# Patient Record
Sex: Female | Born: 1940
Health system: Southern US, Community
[De-identification: ages and names within clinical notes are randomized; demographics above are authoritative.]

## PROBLEM LIST (undated history)

## (undated) DIAGNOSIS — I5032 Chronic diastolic (congestive) heart failure: Secondary | ICD-10-CM

## (undated) DIAGNOSIS — I517 Cardiomegaly: Secondary | ICD-10-CM

## (undated) DIAGNOSIS — I447 Left bundle-branch block, unspecified: Secondary | ICD-10-CM

## (undated) DIAGNOSIS — Z6832 Body mass index (BMI) 32.0-32.9, adult: Secondary | ICD-10-CM

## (undated) DIAGNOSIS — H3530 Unspecified macular degeneration: Secondary | ICD-10-CM

## (undated) DIAGNOSIS — M2040 Other hammer toe(s) (acquired), unspecified foot: Secondary | ICD-10-CM

## (undated) DIAGNOSIS — H9190 Unspecified hearing loss, unspecified ear: Secondary | ICD-10-CM

## (undated) DIAGNOSIS — R269 Unspecified abnormalities of gait and mobility: Secondary | ICD-10-CM

## (undated) DIAGNOSIS — R42 Dizziness and giddiness: Secondary | ICD-10-CM

## (undated) DIAGNOSIS — E559 Vitamin D deficiency, unspecified: Secondary | ICD-10-CM

## (undated) DIAGNOSIS — D72819 Decreased white blood cell count, unspecified: Secondary | ICD-10-CM

## (undated) DIAGNOSIS — Z950 Presence of cardiac pacemaker: Secondary | ICD-10-CM

## (undated) DIAGNOSIS — I340 Nonrheumatic mitral (valve) insufficiency: Secondary | ICD-10-CM

## (undated) DIAGNOSIS — I4430 Unspecified atrioventricular block: Secondary | ICD-10-CM

## (undated) DIAGNOSIS — Z7901 Long term (current) use of anticoagulants: Secondary | ICD-10-CM

## (undated) DIAGNOSIS — M150 Primary generalized (osteo)arthritis: Secondary | ICD-10-CM

## (undated) DIAGNOSIS — M8580 Other specified disorders of bone density and structure, unspecified site: Secondary | ICD-10-CM

## (undated) DIAGNOSIS — B351 Tinea unguium: Secondary | ICD-10-CM

## (undated) DIAGNOSIS — M6281 Muscle weakness (generalized): Secondary | ICD-10-CM

## (undated) DIAGNOSIS — N952 Postmenopausal atrophic vaginitis: Secondary | ICD-10-CM

## (undated) DIAGNOSIS — I441 Atrioventricular block, second degree: Secondary | ICD-10-CM

## (undated) DIAGNOSIS — G3184 Mild cognitive impairment, so stated: Secondary | ICD-10-CM

## (undated) DIAGNOSIS — I10 Essential (primary) hypertension: Secondary | ICD-10-CM

## (undated) DIAGNOSIS — Z131 Encounter for screening for diabetes mellitus: Secondary | ICD-10-CM

## (undated) DIAGNOSIS — R7989 Other specified abnormal findings of blood chemistry: Secondary | ICD-10-CM

## (undated) DIAGNOSIS — I4892 Unspecified atrial flutter: Secondary | ICD-10-CM

## (undated) DIAGNOSIS — E785 Hyperlipidemia, unspecified: Secondary | ICD-10-CM

## (undated) DIAGNOSIS — I110 Hypertensive heart disease with heart failure: Secondary | ICD-10-CM

## (undated) DIAGNOSIS — I482 Chronic atrial fibrillation, unspecified: Secondary | ICD-10-CM

## (undated) DIAGNOSIS — N393 Stress incontinence (female) (male): Secondary | ICD-10-CM

## (undated) DIAGNOSIS — R262 Difficulty in walking, not elsewhere classified: Secondary | ICD-10-CM

## (undated) HISTORY — DX: Presence of cardiac pacemaker: Z95.0

## (undated) HISTORY — DX: Chronic atrial fibrillation, unspecified: I48.20

## (undated) HISTORY — DX: Unspecified hearing loss, unspecified ear: H91.90

## (undated) HISTORY — DX: Left bundle-branch block, unspecified: I44.7

## (undated) HISTORY — DX: Unspecified abnormalities of gait and mobility: R26.9

## (undated) HISTORY — PX: REVISION TOTAL HIP ARTHROPLASTY: SHX766

## (undated) HISTORY — DX: Encounter for screening for diabetes mellitus: Z13.1

## (undated) HISTORY — DX: Other specified disorders of bone density and structure, unspecified site: M85.80

## (undated) HISTORY — DX: Stress incontinence (female) (male): N39.3

## (undated) HISTORY — DX: Tinea unguium: B35.1

## (undated) HISTORY — DX: Nonrheumatic mitral (valve) insufficiency: I34.0

## (undated) HISTORY — DX: Other hammer toe(s) (acquired), unspecified foot: M20.40

## (undated) HISTORY — DX: Mild cognitive impairment, so stated: G31.84

## (undated) HISTORY — DX: Hypertensive heart disease with heart failure: I11.0

## (undated) HISTORY — DX: Other specified abnormal findings of blood chemistry: R79.89

## (undated) HISTORY — DX: Muscle weakness (generalized): M62.81

## (undated) HISTORY — DX: Decreased white blood cell count, unspecified: D72.819

## (undated) HISTORY — DX: Primary generalized (osteo)arthritis: M15.0

## (undated) HISTORY — DX: Postmenopausal atrophic vaginitis: N95.2

## (undated) HISTORY — DX: Atrioventricular block, second degree: I44.1

## (undated) HISTORY — DX: Hyperlipidemia, unspecified: E78.5

## (undated) HISTORY — DX: Unspecified atrioventricular block: I44.30

## (undated) HISTORY — DX: Vitamin D deficiency, unspecified: E55.9

## (undated) HISTORY — DX: Essential (primary) hypertension: I10

## (undated) HISTORY — DX: Difficulty in walking, not elsewhere classified: R26.2

## (undated) HISTORY — DX: Chronic diastolic (congestive) heart failure: I50.32

## (undated) HISTORY — DX: Unspecified atrial flutter: I48.92

## (undated) HISTORY — DX: Unspecified macular degeneration: H35.30

## (undated) HISTORY — DX: Long term (current) use of anticoagulants: Z79.01

## (undated) HISTORY — DX: Dizziness and giddiness: R42

## (undated) HISTORY — DX: Cardiomegaly: I51.7

## (undated) HISTORY — PX: CATARACT EXTRACTION: SUR2

## (undated) HISTORY — DX: Body mass index (bmi) 32.0-32.9, adult: Z68.32

---

## 1981-09-26 HISTORY — PX: BONY PELVIS SURGERY: SHX572

## 1981-09-26 HISTORY — PX: ANKLE FRACTURE SURGERY: SHX122

## 1981-09-26 HISTORY — PX: CHEST SURGERY: SHX595

## 1999-09-02 ENCOUNTER — Encounter: Admission: RE | Admit: 1999-09-02 | Discharge: 1999-09-02 | Payer: Self-pay | Admitting: Family Medicine

## 1999-09-02 ENCOUNTER — Encounter: Payer: Self-pay | Admitting: Family Medicine

## 2000-08-29 ENCOUNTER — Encounter: Payer: Self-pay | Admitting: Family Medicine

## 2000-08-29 ENCOUNTER — Ambulatory Visit (HOSPITAL_COMMUNITY): Admission: RE | Admit: 2000-08-29 | Discharge: 2000-08-29 | Payer: Self-pay | Admitting: Family Medicine

## 2000-12-14 ENCOUNTER — Other Ambulatory Visit: Admission: RE | Admit: 2000-12-14 | Discharge: 2000-12-14 | Payer: Self-pay | Admitting: Family Medicine

## 2001-01-16 ENCOUNTER — Encounter: Admission: RE | Admit: 2001-01-16 | Discharge: 2001-01-16 | Payer: Self-pay | Admitting: Family Medicine

## 2001-01-16 ENCOUNTER — Encounter: Payer: Self-pay | Admitting: Family Medicine

## 2001-12-14 ENCOUNTER — Other Ambulatory Visit: Admission: RE | Admit: 2001-12-14 | Discharge: 2001-12-14 | Payer: Self-pay | Admitting: Family Medicine

## 2002-03-21 ENCOUNTER — Encounter: Payer: Self-pay | Admitting: Family Medicine

## 2002-03-21 ENCOUNTER — Encounter: Admission: RE | Admit: 2002-03-21 | Discharge: 2002-03-21 | Payer: Self-pay | Admitting: Family Medicine

## 2003-03-27 ENCOUNTER — Encounter: Payer: Self-pay | Admitting: Family Medicine

## 2003-03-27 ENCOUNTER — Encounter: Admission: RE | Admit: 2003-03-27 | Discharge: 2003-03-27 | Payer: Self-pay | Admitting: Family Medicine

## 2003-05-01 ENCOUNTER — Other Ambulatory Visit: Admission: RE | Admit: 2003-05-01 | Discharge: 2003-05-01 | Payer: Self-pay | Admitting: Family Medicine

## 2004-05-18 ENCOUNTER — Ambulatory Visit (HOSPITAL_COMMUNITY): Admission: RE | Admit: 2004-05-18 | Discharge: 2004-05-18 | Payer: Self-pay | Admitting: Family Medicine

## 2004-06-25 ENCOUNTER — Other Ambulatory Visit: Admission: RE | Admit: 2004-06-25 | Discharge: 2004-06-25 | Payer: Self-pay | Admitting: Family Medicine

## 2004-06-30 ENCOUNTER — Encounter: Admission: RE | Admit: 2004-06-30 | Discharge: 2004-06-30 | Payer: Self-pay | Admitting: Family Medicine

## 2005-06-16 ENCOUNTER — Ambulatory Visit (HOSPITAL_COMMUNITY): Admission: RE | Admit: 2005-06-16 | Discharge: 2005-06-16 | Payer: Self-pay | Admitting: Family Medicine

## 2005-08-30 ENCOUNTER — Encounter: Admission: RE | Admit: 2005-08-30 | Discharge: 2005-08-30 | Payer: Self-pay | Admitting: Family Medicine

## 2006-07-11 ENCOUNTER — Ambulatory Visit (HOSPITAL_COMMUNITY): Admission: RE | Admit: 2006-07-11 | Discharge: 2006-07-11 | Payer: Self-pay | Admitting: Family Medicine

## 2006-09-04 ENCOUNTER — Other Ambulatory Visit: Admission: RE | Admit: 2006-09-04 | Discharge: 2006-09-04 | Payer: Self-pay | Admitting: Family Medicine

## 2007-06-11 ENCOUNTER — Ambulatory Visit (HOSPITAL_COMMUNITY): Admission: RE | Admit: 2007-06-11 | Discharge: 2007-06-11 | Payer: Self-pay | Admitting: Family Medicine

## 2007-06-11 ENCOUNTER — Ambulatory Visit: Payer: Self-pay | Admitting: Vascular Surgery

## 2007-06-11 ENCOUNTER — Encounter: Payer: Self-pay | Admitting: Family Medicine

## 2007-07-18 ENCOUNTER — Ambulatory Visit (HOSPITAL_COMMUNITY): Admission: RE | Admit: 2007-07-18 | Discharge: 2007-07-18 | Payer: Self-pay | Admitting: Family Medicine

## 2007-11-13 ENCOUNTER — Ambulatory Visit: Payer: Self-pay | Admitting: Gastroenterology

## 2007-11-26 ENCOUNTER — Encounter: Payer: Self-pay | Admitting: Gastroenterology

## 2007-11-26 ENCOUNTER — Ambulatory Visit: Payer: Self-pay | Admitting: Gastroenterology

## 2008-07-18 ENCOUNTER — Encounter: Admission: RE | Admit: 2008-07-18 | Discharge: 2008-07-18 | Payer: Self-pay | Admitting: Family Medicine

## 2009-07-20 ENCOUNTER — Encounter: Admission: RE | Admit: 2009-07-20 | Discharge: 2009-07-20 | Payer: Self-pay | Admitting: Family Medicine

## 2010-07-22 ENCOUNTER — Encounter: Admission: RE | Admit: 2010-07-22 | Discharge: 2010-07-22 | Payer: Self-pay | Admitting: Family Medicine

## 2011-07-13 ENCOUNTER — Other Ambulatory Visit: Payer: Self-pay | Admitting: Family Medicine

## 2011-07-13 DIAGNOSIS — Z1231 Encounter for screening mammogram for malignant neoplasm of breast: Secondary | ICD-10-CM

## 2011-07-27 ENCOUNTER — Ambulatory Visit
Admission: RE | Admit: 2011-07-27 | Discharge: 2011-07-27 | Disposition: A | Discharge status: Home / Self Care | Payer: Medicare Other | Source: Ambulatory Visit | Attending: Family Medicine | Admitting: Family Medicine

## 2011-07-27 DIAGNOSIS — Z1231 Encounter for screening mammogram for malignant neoplasm of breast: Secondary | ICD-10-CM

## 2012-06-22 ENCOUNTER — Other Ambulatory Visit: Payer: Self-pay | Admitting: Family Medicine

## 2012-06-22 DIAGNOSIS — Z1231 Encounter for screening mammogram for malignant neoplasm of breast: Secondary | ICD-10-CM

## 2012-07-27 ENCOUNTER — Ambulatory Visit: Payer: Medicare Other

## 2012-08-10 ENCOUNTER — Ambulatory Visit
Admission: RE | Admit: 2012-08-10 | Discharge: 2012-08-10 | Disposition: A | Discharge status: Home / Self Care | Payer: Medicare Other | Source: Ambulatory Visit | Attending: Family Medicine | Admitting: Family Medicine

## 2012-08-10 DIAGNOSIS — Z1231 Encounter for screening mammogram for malignant neoplasm of breast: Secondary | ICD-10-CM

## 2013-08-05 ENCOUNTER — Other Ambulatory Visit: Payer: Self-pay

## 2013-08-05 ENCOUNTER — Other Ambulatory Visit (HOSPITAL_COMMUNITY): Payer: Self-pay | Admitting: Family Medicine

## 2013-08-05 DIAGNOSIS — Z1231 Encounter for screening mammogram for malignant neoplasm of breast: Secondary | ICD-10-CM

## 2013-08-21 ENCOUNTER — Ambulatory Visit (HOSPITAL_COMMUNITY)
Admission: RE | Admit: 2013-08-21 | Discharge: 2013-08-21 | Disposition: A | Discharge status: Home / Self Care | Payer: Medicare Other | Source: Ambulatory Visit | Attending: Family Medicine | Admitting: Family Medicine

## 2013-08-21 DIAGNOSIS — Z1231 Encounter for screening mammogram for malignant neoplasm of breast: Secondary | ICD-10-CM | POA: Insufficient documentation

## 2014-07-24 ENCOUNTER — Other Ambulatory Visit (HOSPITAL_COMMUNITY): Payer: Self-pay | Admitting: Family Medicine

## 2014-07-24 DIAGNOSIS — Z1231 Encounter for screening mammogram for malignant neoplasm of breast: Secondary | ICD-10-CM

## 2014-08-25 ENCOUNTER — Ambulatory Visit (HOSPITAL_COMMUNITY)
Admission: RE | Admit: 2014-08-25 | Discharge: 2014-08-25 | Disposition: A | Discharge status: Home / Self Care | Payer: Medicare HMO | Source: Ambulatory Visit | Attending: Family Medicine | Admitting: Family Medicine

## 2014-08-25 DIAGNOSIS — Z1231 Encounter for screening mammogram for malignant neoplasm of breast: Secondary | ICD-10-CM | POA: Insufficient documentation

## 2015-03-23 ENCOUNTER — Other Ambulatory Visit: Payer: Self-pay

## 2015-04-09 ENCOUNTER — Encounter: Payer: Self-pay | Admitting: Gastroenterology

## 2015-08-12 ENCOUNTER — Other Ambulatory Visit: Payer: Self-pay

## 2015-08-12 DIAGNOSIS — Z1231 Encounter for screening mammogram for malignant neoplasm of breast: Secondary | ICD-10-CM

## 2015-09-18 ENCOUNTER — Ambulatory Visit
Admission: RE | Admit: 2015-09-18 | Discharge: 2015-09-18 | Disposition: A | Discharge status: Home / Self Care | Payer: Medicare HMO | Source: Ambulatory Visit

## 2015-09-18 DIAGNOSIS — Z1231 Encounter for screening mammogram for malignant neoplasm of breast: Secondary | ICD-10-CM

## 2015-10-28 HISTORY — PX: PACEMAKER IMPLANT: EP1218

## 2015-11-09 DIAGNOSIS — D72819 Decreased white blood cell count, unspecified: Secondary | ICD-10-CM | POA: Insufficient documentation

## 2015-11-09 DIAGNOSIS — G629 Polyneuropathy, unspecified: Secondary | ICD-10-CM

## 2015-11-09 DIAGNOSIS — E785 Hyperlipidemia, unspecified: Secondary | ICD-10-CM

## 2015-11-09 DIAGNOSIS — M159 Polyosteoarthritis, unspecified: Secondary | ICD-10-CM

## 2015-11-09 DIAGNOSIS — M8949 Other hypertrophic osteoarthropathy, multiple sites: Secondary | ICD-10-CM

## 2015-11-09 DIAGNOSIS — M8580 Other specified disorders of bone density and structure, unspecified site: Secondary | ICD-10-CM

## 2015-11-09 DIAGNOSIS — M150 Primary generalized (osteo)arthritis: Secondary | ICD-10-CM | POA: Insufficient documentation

## 2015-11-09 DIAGNOSIS — R269 Unspecified abnormalities of gait and mobility: Secondary | ICD-10-CM

## 2015-11-09 HISTORY — DX: Decreased white blood cell count, unspecified: D72.819

## 2015-11-09 HISTORY — DX: Polyosteoarthritis, unspecified: M15.9

## 2015-11-09 HISTORY — DX: Other specified disorders of bone density and structure, unspecified site: M85.80

## 2015-11-09 HISTORY — DX: Unspecified abnormalities of gait and mobility: R26.9

## 2015-11-09 HISTORY — DX: Polyneuropathy, unspecified: G62.9

## 2015-11-09 HISTORY — DX: Hyperlipidemia, unspecified: E78.5

## 2015-11-09 HISTORY — DX: Other hypertrophic osteoarthropathy, multiple sites: M89.49

## 2015-11-09 HISTORY — DX: Primary generalized (osteo)arthritis: M15.0

## 2015-11-10 DIAGNOSIS — I4430 Unspecified atrioventricular block: Secondary | ICD-10-CM | POA: Insufficient documentation

## 2015-11-10 HISTORY — DX: Unspecified atrioventricular block: I44.30

## 2015-11-11 DIAGNOSIS — Z950 Presence of cardiac pacemaker: Secondary | ICD-10-CM | POA: Insufficient documentation

## 2015-11-11 HISTORY — DX: Presence of cardiac pacemaker: Z95.0

## 2015-12-16 DIAGNOSIS — I441 Atrioventricular block, second degree: Secondary | ICD-10-CM | POA: Insufficient documentation

## 2015-12-16 DIAGNOSIS — R262 Difficulty in walking, not elsewhere classified: Secondary | ICD-10-CM

## 2015-12-16 HISTORY — DX: Atrioventricular block, second degree: I44.1

## 2015-12-16 HISTORY — DX: Difficulty in walking, not elsewhere classified: R26.2

## 2016-01-17 DIAGNOSIS — I5032 Chronic diastolic (congestive) heart failure: Secondary | ICD-10-CM

## 2016-01-17 DIAGNOSIS — I482 Chronic atrial fibrillation, unspecified: Secondary | ICD-10-CM

## 2016-01-17 DIAGNOSIS — I447 Left bundle-branch block, unspecified: Secondary | ICD-10-CM | POA: Insufficient documentation

## 2016-01-17 DIAGNOSIS — I4892 Unspecified atrial flutter: Secondary | ICD-10-CM

## 2016-01-17 HISTORY — DX: Chronic atrial fibrillation, unspecified: I48.20

## 2016-01-17 HISTORY — DX: Chronic diastolic (congestive) heart failure: I50.32

## 2016-01-17 HISTORY — DX: Unspecified atrial flutter: I48.92

## 2016-01-17 HISTORY — DX: Left bundle-branch block, unspecified: I44.7

## 2016-02-03 DIAGNOSIS — I110 Hypertensive heart disease with heart failure: Secondary | ICD-10-CM | POA: Insufficient documentation

## 2016-02-03 DIAGNOSIS — G3184 Mild cognitive impairment, so stated: Secondary | ICD-10-CM

## 2016-02-03 HISTORY — DX: Mild cognitive impairment of uncertain or unknown etiology: G31.84

## 2016-02-03 HISTORY — DX: Hypertensive heart disease with heart failure: I11.0

## 2016-03-25 DIAGNOSIS — B351 Tinea unguium: Secondary | ICD-10-CM

## 2016-03-25 DIAGNOSIS — M6281 Muscle weakness (generalized): Secondary | ICD-10-CM | POA: Insufficient documentation

## 2016-03-25 DIAGNOSIS — Z6832 Body mass index (BMI) 32.0-32.9, adult: Secondary | ICD-10-CM

## 2016-03-25 DIAGNOSIS — N952 Postmenopausal atrophic vaginitis: Secondary | ICD-10-CM

## 2016-03-25 DIAGNOSIS — M2040 Other hammer toe(s) (acquired), unspecified foot: Secondary | ICD-10-CM

## 2016-03-25 DIAGNOSIS — H9190 Unspecified hearing loss, unspecified ear: Secondary | ICD-10-CM

## 2016-03-25 HISTORY — DX: Body mass index (BMI) 32.0-32.9, adult: Z68.32

## 2016-03-25 HISTORY — DX: Muscle weakness (generalized): M62.81

## 2016-03-25 HISTORY — DX: Unspecified hearing loss, unspecified ear: H91.90

## 2016-03-25 HISTORY — DX: Postmenopausal atrophic vaginitis: N95.2

## 2016-03-25 HISTORY — DX: Tinea unguium: B35.1

## 2016-03-25 HISTORY — DX: Other hammer toe(s) (acquired), unspecified foot: M20.40

## 2016-04-17 DIAGNOSIS — Z7901 Long term (current) use of anticoagulants: Secondary | ICD-10-CM | POA: Insufficient documentation

## 2016-04-17 HISTORY — DX: Long term (current) use of anticoagulants: Z79.01

## 2016-10-07 DIAGNOSIS — Z950 Presence of cardiac pacemaker: Secondary | ICD-10-CM | POA: Diagnosis not present

## 2016-12-09 DIAGNOSIS — I5032 Chronic diastolic (congestive) heart failure: Secondary | ICD-10-CM | POA: Diagnosis not present

## 2016-12-09 DIAGNOSIS — G629 Polyneuropathy, unspecified: Secondary | ICD-10-CM | POA: Diagnosis not present

## 2016-12-09 DIAGNOSIS — Z950 Presence of cardiac pacemaker: Secondary | ICD-10-CM | POA: Diagnosis not present

## 2016-12-09 DIAGNOSIS — I483 Typical atrial flutter: Secondary | ICD-10-CM | POA: Diagnosis not present

## 2016-12-09 DIAGNOSIS — Z7901 Long term (current) use of anticoagulants: Secondary | ICD-10-CM | POA: Diagnosis not present

## 2016-12-09 DIAGNOSIS — R262 Difficulty in walking, not elsewhere classified: Secondary | ICD-10-CM | POA: Diagnosis not present

## 2016-12-09 DIAGNOSIS — I110 Hypertensive heart disease with heart failure: Secondary | ICD-10-CM | POA: Diagnosis not present

## 2016-12-20 DIAGNOSIS — Z45018 Encounter for adjustment and management of other part of cardiac pacemaker: Secondary | ICD-10-CM | POA: Diagnosis not present

## 2016-12-20 DIAGNOSIS — I447 Left bundle-branch block, unspecified: Secondary | ICD-10-CM | POA: Diagnosis not present

## 2017-02-08 DIAGNOSIS — I5032 Chronic diastolic (congestive) heart failure: Secondary | ICD-10-CM | POA: Diagnosis not present

## 2017-02-08 DIAGNOSIS — I110 Hypertensive heart disease with heart failure: Secondary | ICD-10-CM | POA: Diagnosis not present

## 2017-02-08 DIAGNOSIS — Z950 Presence of cardiac pacemaker: Secondary | ICD-10-CM | POA: Diagnosis not present

## 2017-02-08 DIAGNOSIS — Z7901 Long term (current) use of anticoagulants: Secondary | ICD-10-CM | POA: Diagnosis not present

## 2017-05-25 DIAGNOSIS — Z6841 Body Mass Index (BMI) 40.0 and over, adult: Secondary | ICD-10-CM | POA: Diagnosis not present

## 2017-05-25 DIAGNOSIS — E7800 Pure hypercholesterolemia, unspecified: Secondary | ICD-10-CM | POA: Diagnosis not present

## 2017-05-25 DIAGNOSIS — Z0000 Encounter for general adult medical examination without abnormal findings: Secondary | ICD-10-CM | POA: Diagnosis not present

## 2017-05-25 DIAGNOSIS — Z79899 Other long term (current) drug therapy: Secondary | ICD-10-CM | POA: Diagnosis not present

## 2017-05-25 DIAGNOSIS — I110 Hypertensive heart disease with heart failure: Secondary | ICD-10-CM | POA: Diagnosis not present

## 2017-05-25 DIAGNOSIS — Z7901 Long term (current) use of anticoagulants: Secondary | ICD-10-CM | POA: Diagnosis not present

## 2017-05-25 DIAGNOSIS — M130 Polyarthritis, unspecified: Secondary | ICD-10-CM | POA: Diagnosis not present

## 2017-05-25 DIAGNOSIS — Z950 Presence of cardiac pacemaker: Secondary | ICD-10-CM | POA: Diagnosis not present

## 2017-05-25 DIAGNOSIS — I509 Heart failure, unspecified: Secondary | ICD-10-CM | POA: Diagnosis not present

## 2017-06-06 DIAGNOSIS — Z131 Encounter for screening for diabetes mellitus: Secondary | ICD-10-CM

## 2017-06-06 DIAGNOSIS — I10 Essential (primary) hypertension: Secondary | ICD-10-CM

## 2017-06-06 DIAGNOSIS — R7303 Prediabetes: Secondary | ICD-10-CM

## 2017-06-06 HISTORY — DX: Encounter for screening for diabetes mellitus: Z13.1

## 2017-06-06 HISTORY — DX: Essential (primary) hypertension: I10

## 2017-06-06 HISTORY — DX: Prediabetes: R73.03

## 2017-06-09 DIAGNOSIS — E559 Vitamin D deficiency, unspecified: Secondary | ICD-10-CM

## 2017-06-09 DIAGNOSIS — R42 Dizziness and giddiness: Secondary | ICD-10-CM

## 2017-06-09 DIAGNOSIS — H3530 Unspecified macular degeneration: Secondary | ICD-10-CM

## 2017-06-09 DIAGNOSIS — R7989 Other specified abnormal findings of blood chemistry: Secondary | ICD-10-CM | POA: Insufficient documentation

## 2017-06-09 DIAGNOSIS — N393 Stress incontinence (female) (male): Secondary | ICD-10-CM | POA: Insufficient documentation

## 2017-06-09 HISTORY — DX: Vitamin D deficiency, unspecified: E55.9

## 2017-06-09 HISTORY — DX: Other specified abnormal findings of blood chemistry: R79.89

## 2017-06-09 HISTORY — DX: Dizziness and giddiness: R42

## 2017-06-09 HISTORY — DX: Stress incontinence (female) (male): N39.3

## 2017-06-09 HISTORY — DX: Unspecified macular degeneration: H35.30

## 2017-06-12 DIAGNOSIS — R771 Abnormality of globulin: Secondary | ICD-10-CM | POA: Diagnosis not present

## 2017-06-12 DIAGNOSIS — I110 Hypertensive heart disease with heart failure: Secondary | ICD-10-CM | POA: Diagnosis not present

## 2017-06-12 DIAGNOSIS — Z131 Encounter for screening for diabetes mellitus: Secondary | ICD-10-CM | POA: Diagnosis not present

## 2017-06-12 DIAGNOSIS — R739 Hyperglycemia, unspecified: Secondary | ICD-10-CM | POA: Diagnosis not present

## 2017-06-12 DIAGNOSIS — I482 Chronic atrial fibrillation: Secondary | ICD-10-CM | POA: Diagnosis not present

## 2017-06-12 DIAGNOSIS — E782 Mixed hyperlipidemia: Secondary | ICD-10-CM | POA: Diagnosis not present

## 2017-06-12 DIAGNOSIS — I5022 Chronic systolic (congestive) heart failure: Secondary | ICD-10-CM | POA: Diagnosis not present

## 2017-06-12 DIAGNOSIS — I5032 Chronic diastolic (congestive) heart failure: Secondary | ICD-10-CM | POA: Diagnosis not present

## 2017-06-21 DIAGNOSIS — H2513 Age-related nuclear cataract, bilateral: Secondary | ICD-10-CM | POA: Diagnosis not present

## 2017-06-26 ENCOUNTER — Telehealth: Payer: Self-pay | Admitting: Cardiology

## 2017-06-26 NOTE — Telephone Encounter (Signed)
Pt states that Eliquis cost too much, and she can not afford it.   Pt is due to see Dr Dulce Sellar in Nov.  Appt was made for Jul 31, 2017 @ 10:20 am.  Pt aware of appt date and time.   Pt states she has enough Eliquis until she is scheduled to see Dr Dulce Sellar, and will discuss changing to another medication at that time.   Pt verbalized understanding.

## 2017-06-26 NOTE — Telephone Encounter (Signed)
Patient states that her blood thinner is too expensive and wants to switch to a cheaper one. Please calll her

## 2017-07-10 ENCOUNTER — Encounter: Payer: Self-pay | Admitting: *Deleted

## 2017-07-10 DIAGNOSIS — I441 Atrioventricular block, second degree: Secondary | ICD-10-CM

## 2017-07-10 HISTORY — DX: Atrioventricular block, second degree: I44.1

## 2017-07-18 ENCOUNTER — Other Ambulatory Visit: Payer: Self-pay | Admitting: *Deleted

## 2017-07-27 DIAGNOSIS — E782 Mixed hyperlipidemia: Secondary | ICD-10-CM | POA: Diagnosis not present

## 2017-07-31 ENCOUNTER — Ambulatory Visit: Payer: Medicare HMO | Admitting: Cardiology
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
## 2017-08-03 ENCOUNTER — Other Ambulatory Visit: Payer: Self-pay | Admitting: Cardiology

## 2017-08-03 ENCOUNTER — Ambulatory Visit (INDEPENDENT_AMBULATORY_CARE_PROVIDER_SITE_OTHER): Payer: Medicare HMO | Admitting: Cardiology

## 2017-08-03 ENCOUNTER — Encounter: Payer: Self-pay | Admitting: Cardiology

## 2017-08-03 VITALS — BP 118/66 | HR 63 | Ht 64.0 in | Wt 186.0 lb

## 2017-08-03 DIAGNOSIS — I5032 Chronic diastolic (congestive) heart failure: Secondary | ICD-10-CM | POA: Diagnosis not present

## 2017-08-03 DIAGNOSIS — I447 Left bundle-branch block, unspecified: Secondary | ICD-10-CM | POA: Diagnosis not present

## 2017-08-03 DIAGNOSIS — Z950 Presence of cardiac pacemaker: Secondary | ICD-10-CM | POA: Diagnosis not present

## 2017-08-03 DIAGNOSIS — I110 Hypertensive heart disease with heart failure: Secondary | ICD-10-CM

## 2017-08-03 DIAGNOSIS — Z7901 Long term (current) use of anticoagulants: Secondary | ICD-10-CM

## 2017-08-03 DIAGNOSIS — E785 Hyperlipidemia, unspecified: Secondary | ICD-10-CM

## 2017-08-03 NOTE — Patient Instructions (Signed)
Medication Instructions:  Your physician recommends that you continue on your current medications as directed. Please refer to the Current Medication list given to you today.  Labwork: Your physician recommends that you return for lab work in: today. BMP  Testing/Procedures: None  Follow-Up: Your physician wants you to follow-up in: 7 months (July, 2019). You will receive a reminder letter in the mail two months in advance. If you don't receive a letter, please call our office to schedule the follow-up appointment.  You will receive a phone call to schedule with electrophysiology.  Any Other Special Instructions Will Be Listed Below (If Applicable).     If you need a refill on your cardiac medications before your next appointment, please call your pharmacy.

## 2017-08-04 LAB — BASIC METABOLIC PANEL
BUN / CREAT RATIO: 21 (ref 12–28)
BUN: 14 mg/dL (ref 8–27)
CALCIUM: 10.7 mg/dL — AB (ref 8.7–10.3)
CHLORIDE: 97 mmol/L (ref 96–106)
CO2: 31 mmol/L — ABNORMAL HIGH (ref 20–29)
Creatinine, Ser: 0.67 mg/dL (ref 0.57–1.00)
GFR calc non Af Amer: 85 mL/min/{1.73_m2} (ref >59)
GFR, EST AFRICAN AMERICAN: 98 mL/min/{1.73_m2} (ref >59)
Glucose: 100 mg/dL — ABNORMAL HIGH (ref 65–99)
Potassium: 4 mmol/L (ref 3.5–5.2)
Sodium: 141 mmol/L (ref 134–144)

## 2017-08-15 DIAGNOSIS — H25811 Combined forms of age-related cataract, right eye: Secondary | ICD-10-CM | POA: Diagnosis not present

## 2017-08-16 DIAGNOSIS — R69 Illness, unspecified: Secondary | ICD-10-CM | POA: Diagnosis not present

## 2017-08-21 DIAGNOSIS — Z0000 Encounter for general adult medical examination without abnormal findings: Secondary | ICD-10-CM | POA: Diagnosis not present

## 2017-08-21 DIAGNOSIS — Z780 Asymptomatic menopausal state: Secondary | ICD-10-CM | POA: Diagnosis not present

## 2017-08-21 DIAGNOSIS — Z13820 Encounter for screening for osteoporosis: Secondary | ICD-10-CM | POA: Diagnosis not present

## 2017-08-21 DIAGNOSIS — Z1231 Encounter for screening mammogram for malignant neoplasm of breast: Secondary | ICD-10-CM | POA: Diagnosis not present

## 2017-08-21 HISTORY — DX: Asymptomatic menopausal state: Z78.0

## 2017-09-13 DIAGNOSIS — H25811 Combined forms of age-related cataract, right eye: Secondary | ICD-10-CM | POA: Diagnosis not present

## 2017-09-13 DIAGNOSIS — H2511 Age-related nuclear cataract, right eye: Secondary | ICD-10-CM | POA: Diagnosis not present

## 2017-09-20 DIAGNOSIS — I517 Cardiomegaly: Secondary | ICD-10-CM | POA: Insufficient documentation

## 2017-09-20 DIAGNOSIS — I340 Nonrheumatic mitral (valve) insufficiency: Secondary | ICD-10-CM | POA: Insufficient documentation

## 2017-09-25 DIAGNOSIS — M8580 Other specified disorders of bone density and structure, unspecified site: Secondary | ICD-10-CM | POA: Diagnosis not present

## 2017-09-25 DIAGNOSIS — Z1231 Encounter for screening mammogram for malignant neoplasm of breast: Secondary | ICD-10-CM | POA: Diagnosis not present

## 2017-09-25 DIAGNOSIS — M8589 Other specified disorders of bone density and structure, multiple sites: Secondary | ICD-10-CM | POA: Diagnosis not present

## 2017-09-25 DIAGNOSIS — Z780 Asymptomatic menopausal state: Secondary | ICD-10-CM | POA: Diagnosis not present

## 2017-09-29 DIAGNOSIS — Z01818 Encounter for other preprocedural examination: Secondary | ICD-10-CM | POA: Diagnosis not present

## 2017-09-29 DIAGNOSIS — H25812 Combined forms of age-related cataract, left eye: Secondary | ICD-10-CM | POA: Diagnosis not present

## 2017-09-29 DIAGNOSIS — H353131 Nonexudative age-related macular degeneration, bilateral, early dry stage: Secondary | ICD-10-CM | POA: Diagnosis not present

## 2017-10-02 ENCOUNTER — Telehealth: Payer: Self-pay | Admitting: Cardiology

## 2017-10-02 NOTE — Telephone Encounter (Signed)
Patient advised warfarin will be cheaper, but will require frequent blood checks. Patient states that her insurance company is sending her an application for Eliquis to be cheaper. Patient states she will run out of Eliquis Wednesday 10/04/17. Advised patient I could give her some samples. Patient is coming tomorrow to pick up Eliquis 5 mg samples.

## 2017-10-02 NOTE — Telephone Encounter (Signed)
Wants to know if Dr Dulce SellarMunley can prescribe something cheaper than eliquis

## 2017-10-02 NOTE — Telephone Encounter (Signed)
Verified with patient that dose is Eliquis 5 mg twice daily. Patient verbalized understanding. No further questions.

## 2017-10-02 NOTE — Telephone Encounter (Signed)
Ppatient is questioning her Eloquis dosage.

## 2017-10-02 NOTE — Telephone Encounter (Signed)
Please advise 

## 2017-10-02 NOTE — Telephone Encounter (Signed)
Only warfarin but I would not advise that

## 2017-10-03 ENCOUNTER — Other Ambulatory Visit: Payer: Self-pay | Admitting: *Deleted

## 2017-10-03 NOTE — Telephone Encounter (Signed)
Pt came [facility] today [facility] Pt was given 2 boxes of 14 pills each/ 28 days worth.

## 2017-10-11 ENCOUNTER — Ambulatory Visit (INDEPENDENT_AMBULATORY_CARE_PROVIDER_SITE_OTHER): Payer: Medicare HMO | Admitting: Cardiology

## 2017-10-11 ENCOUNTER — Encounter: Payer: Self-pay | Admitting: Cardiology

## 2017-10-11 VITALS — BP 133/76 | HR 64 | Ht 64.0 in | Wt 185.0 lb

## 2017-10-11 DIAGNOSIS — I10 Essential (primary) hypertension: Secondary | ICD-10-CM | POA: Diagnosis not present

## 2017-10-11 DIAGNOSIS — I480 Paroxysmal atrial fibrillation: Secondary | ICD-10-CM | POA: Diagnosis not present

## 2017-10-11 DIAGNOSIS — E785 Hyperlipidemia, unspecified: Secondary | ICD-10-CM | POA: Diagnosis not present

## 2017-10-11 DIAGNOSIS — I441 Atrioventricular block, second degree: Secondary | ICD-10-CM | POA: Diagnosis not present

## 2017-10-11 DIAGNOSIS — I5032 Chronic diastolic (congestive) heart failure: Secondary | ICD-10-CM | POA: Diagnosis not present

## 2017-10-11 NOTE — Patient Instructions (Signed)
Medication Instructions:  Your physician recommends that you continue on your current medications as directed. Please refer to the Current Medication list given to you today.  * If you need a refill on your cardiac medications before your next appointment, please call your pharmacy. *  Labwork: None ordered  Testing/Procedures: None ordered  Follow-Up: Your physician wants you to follow-up in: 1 year with Dr. Elberta Fortisamnitz.  You will receive a reminder letter in the mail two months in advance. If you don't receive a letter, please call our office to schedule the follow-up appointment.  Thank you for choosing [facility]!!   Dory HornSherri Lavarr President, RN [phone]  Any Other Special Instructions Will Be Listed Below (If Applicable). Our office will contact you about arranging home monitoring.
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
## 2017-10-12 ENCOUNTER — Other Ambulatory Visit: Payer: Self-pay

## 2017-10-12 ENCOUNTER — Telehealth: Payer: Self-pay | Admitting: Cardiology

## 2017-10-12 MED ORDER — APIXABAN 5 MG PO TABS: 5.0000 mg | ORAL_TABLET | Freq: Two times a day (BID) | ORAL | 1 refills | Status: DC

## 2017-10-12 NOTE — Telephone Encounter (Signed)
Med refill sent

## 2017-10-12 NOTE — Telephone Encounter (Signed)
Wanita says that there was a refill for Eliquis that was denied by physician and she is unsure why.

## 2017-10-13 ENCOUNTER — Telehealth: Payer: Self-pay | Admitting: Cardiology

## 2017-10-13 ENCOUNTER — Other Ambulatory Visit: Payer: Self-pay

## 2017-10-13 MED ORDER — DABIGATRAN ETEXILATE MESYLATE 150 MG PO CAPS: 150.0000 mg | ORAL_CAPSULE | Freq: Two times a day (BID) | ORAL | 1 refills | Status: DC

## 2017-10-13 MED ORDER — APIXABAN 5 MG PO TABS: 5.0000 mg | ORAL_TABLET | Freq: Two times a day (BID) | ORAL | 1 refills | Status: DC

## 2017-10-13 NOTE — Telephone Encounter (Signed)
Patient called back to state that it would actually be cheaper for her to be on eliquis. Final med switch was made.

## 2017-10-13 NOTE — Telephone Encounter (Signed)
Has questions about medication

## 2017-10-13 NOTE — Telephone Encounter (Signed)
Patient wanted to be switched from eliquis to pradaxa. Medication was sent in; patient informed to hol eliquis for 1 full day then starting pradaxa.

## 2017-10-18 DIAGNOSIS — H25812 Combined forms of age-related cataract, left eye: Secondary | ICD-10-CM | POA: Diagnosis not present

## 2017-10-18 DIAGNOSIS — H2512 Age-related nuclear cataract, left eye: Secondary | ICD-10-CM | POA: Diagnosis not present

## 2017-11-09 ENCOUNTER — Telehealth: Payer: Self-pay

## 2017-11-09 MED ORDER — DABIGATRAN ETEXILATE MESYLATE 75 MG PO CAPS: 75.0000 mg | ORAL_CAPSULE | Freq: Two times a day (BID) | ORAL | 3 refills | Status: DC

## 2017-11-09 NOTE — Telephone Encounter (Signed)
Patient called stating that she would like to be switched to Pradaxa. Dr. Dulce SellarMunley advised patient could stop Eliquis for one day and start Pradaxa 150 mg twice daily. Patient verbalized understanding, no further questions.

## 2017-11-10 ENCOUNTER — Other Ambulatory Visit: Payer: Self-pay

## 2017-11-10 MED ORDER — DABIGATRAN ETEXILATE MESYLATE 150 MG PO CAPS: 150.0000 mg | ORAL_CAPSULE | Freq: Two times a day (BID) | ORAL | 3 refills | Status: DC

## 2017-11-17 DIAGNOSIS — H524 Presbyopia: Secondary | ICD-10-CM | POA: Diagnosis not present

## 2017-11-24 DIAGNOSIS — Z961 Presence of intraocular lens: Secondary | ICD-10-CM | POA: Diagnosis not present

## 2017-11-27 ENCOUNTER — Telehealth: Payer: Self-pay | Admitting: Cardiology

## 2017-11-27 ENCOUNTER — Other Ambulatory Visit: Payer: Self-pay

## 2017-11-27 MED ORDER — DABIGATRAN ETEXILATE MESYLATE 150 MG PO CAPS: 150.0000 mg | ORAL_CAPSULE | Freq: Two times a day (BID) | ORAL | 3 refills | Status: DC

## 2017-11-27 NOTE — Telephone Encounter (Signed)
Patient advised that Pradaxa assistance forms were received today, filled out, and mailed back to the company. Patient verbalized understanding. No further questions.

## 2017-11-27 NOTE — Telephone Encounter (Signed)
Please call about refill assistance

## 2017-11-29 IMAGING — MG MM DIGITAL SCREENING BILAT W/ TOMO W/ CAD
6 of 9 series · 6 of 25 positions shown · non-contrast
Comparison: Previous exam(s).

CLINICAL DATA: Screening.

EXAM:
DIGITAL SCREENING BILATERAL MAMMOGRAM WITH 3D TOMO WITH CAD

[R MLO (1 of 2)]
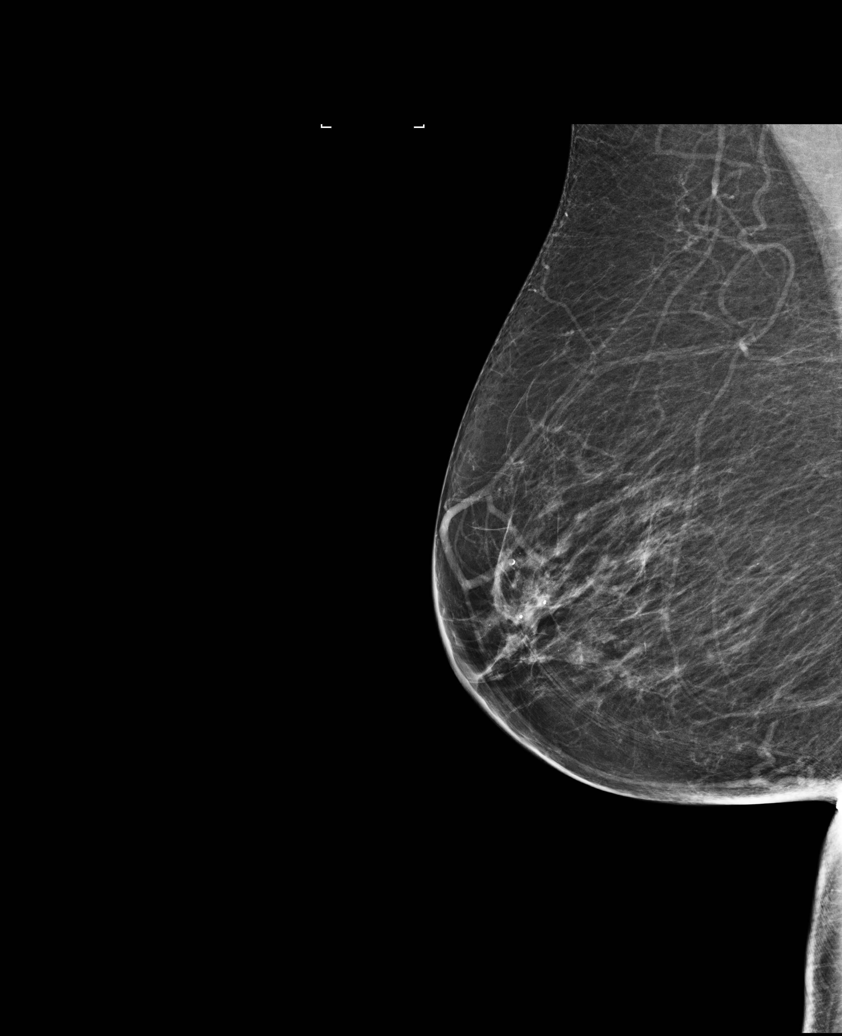

[L MLO]
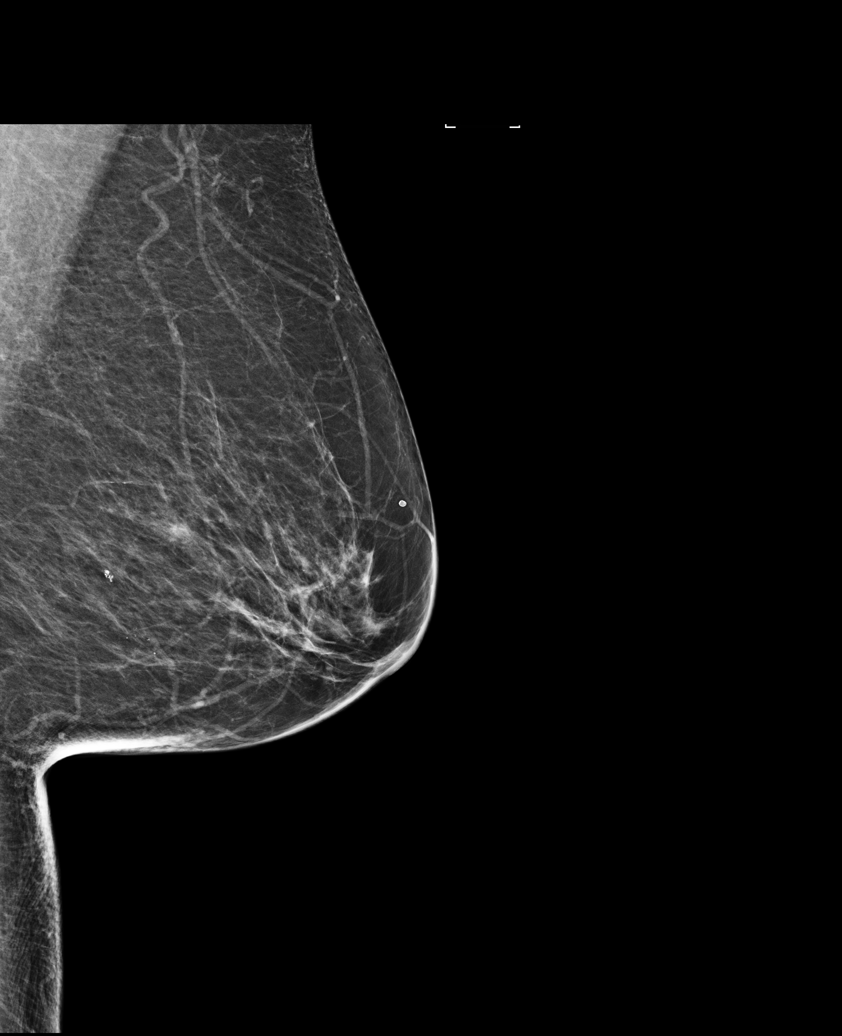

[R CC]
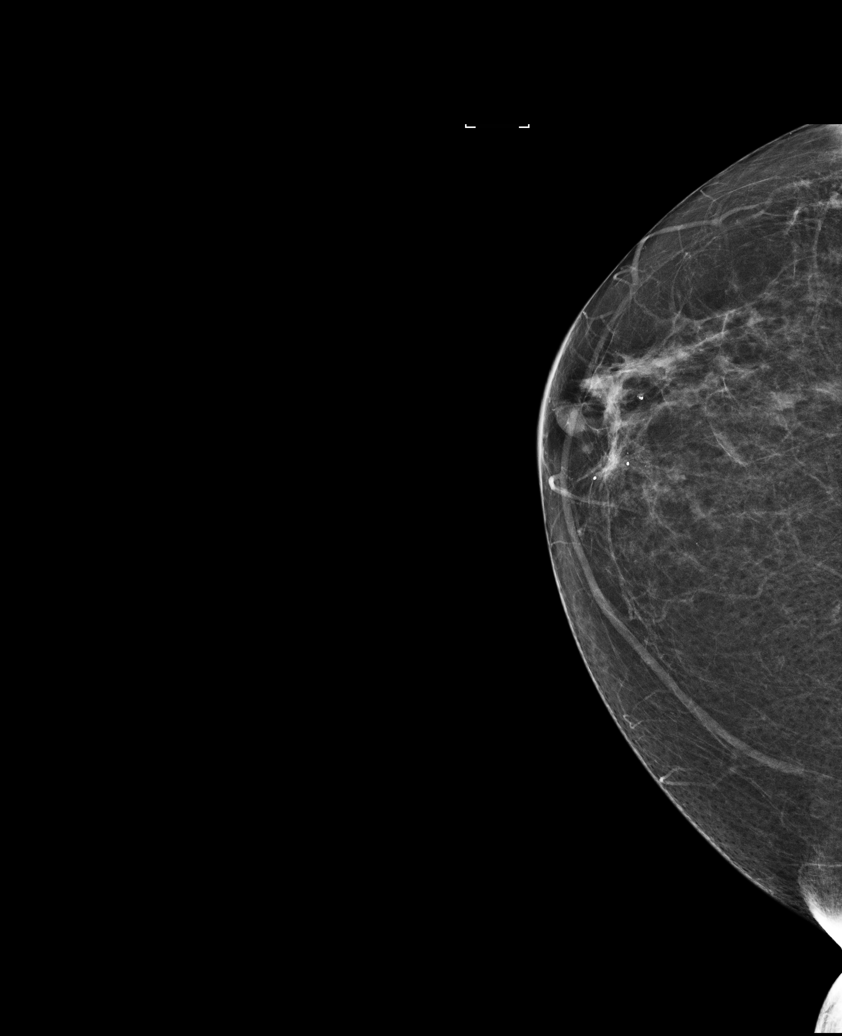

[R MLO (2 of 2)]
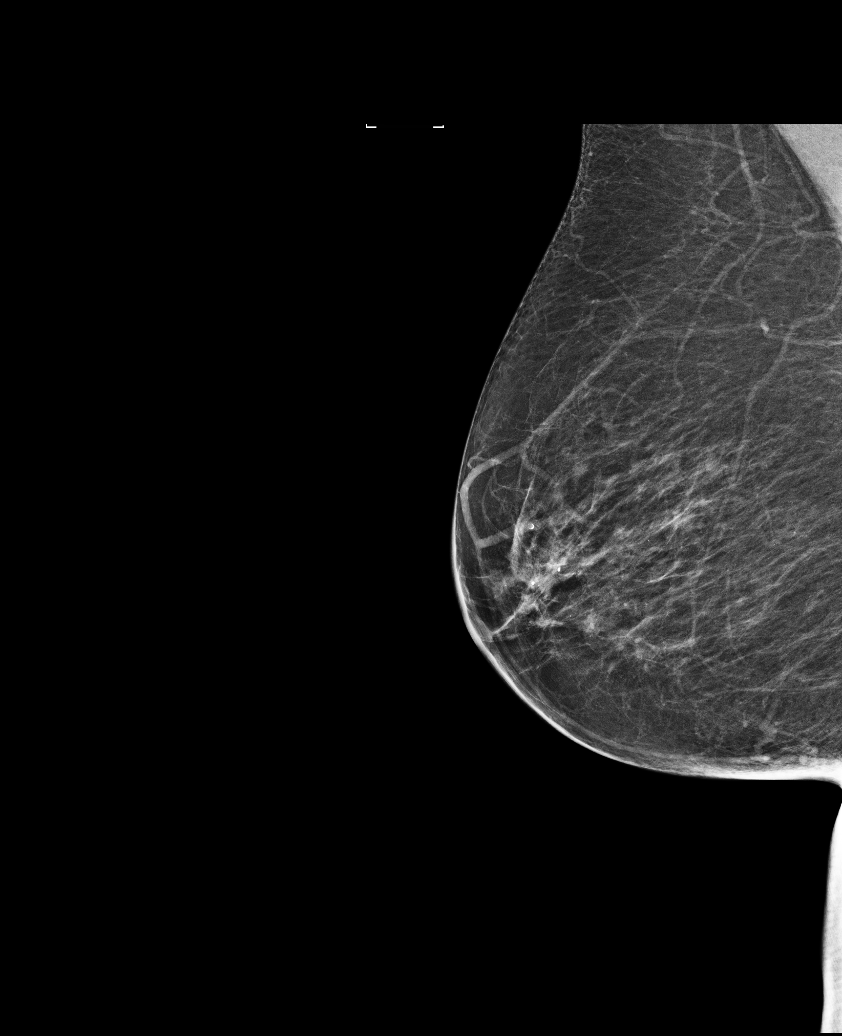

[L CC]
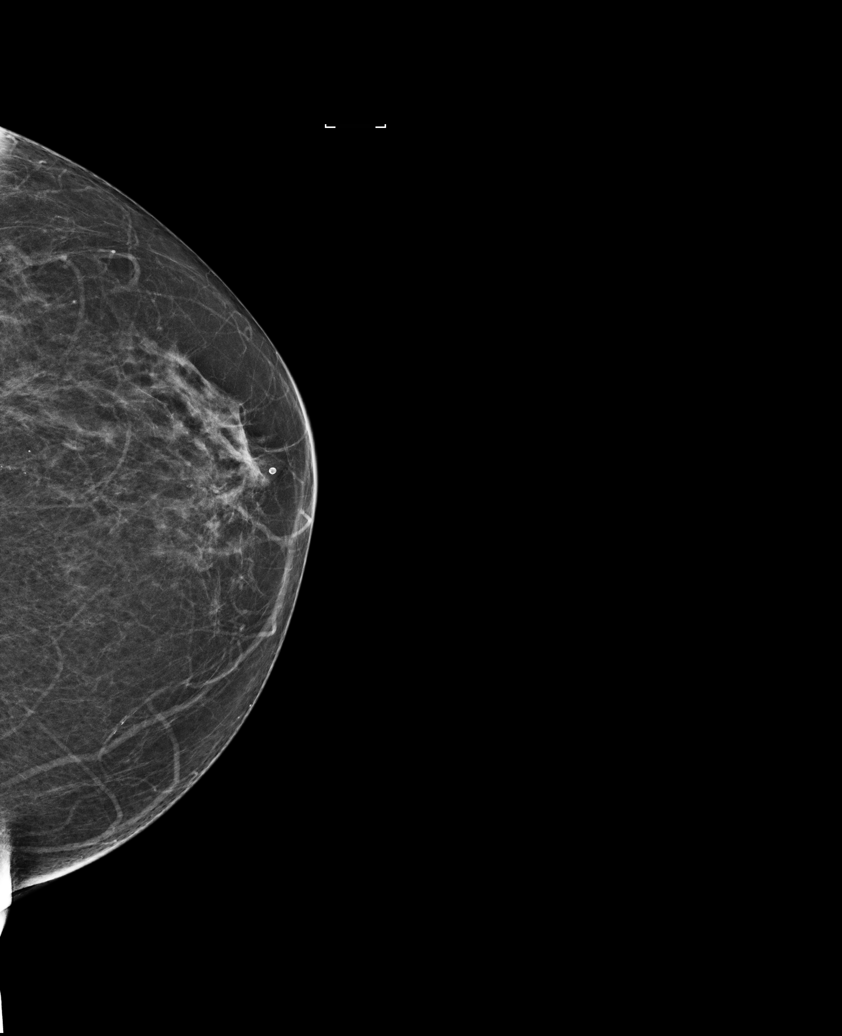

[L MLO tomo · tomo slice 33/65.0]
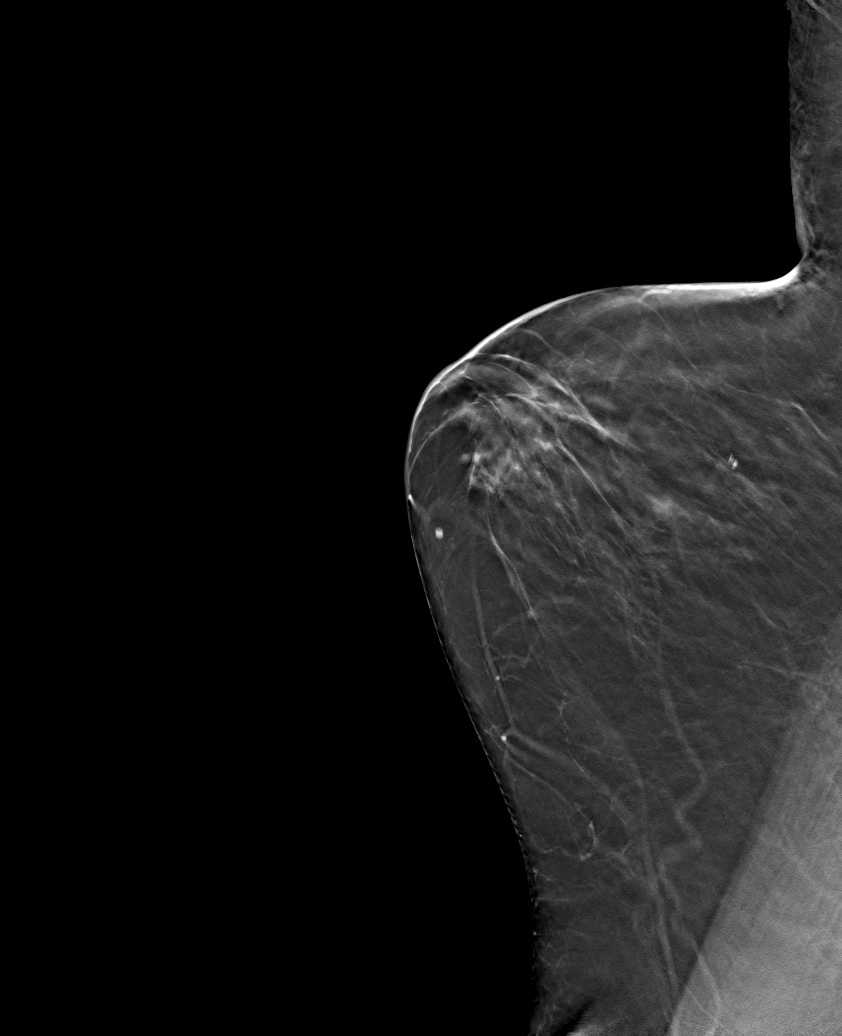

[6 of 25 positions shown; findings below may reference images not displayed]

ACR Breast Density Category b: There are scattered areas of
fibroglandular density.
FINDINGS: There are no findings suspicious for malignancy. Images were
processed with CAD.
IMPRESSION: No mammographic evidence of malignancy. A result letter of this
screening mammogram will be mailed directly to the patient.

RECOMMENDATION:
Screening mammogram in one year. (Code:55-L-23V)

BI-RADS CATEGORY  1: Negative.

## 2017-12-05 ENCOUNTER — Telehealth: Payer: Self-pay | Admitting: Cardiology

## 2017-12-05 NOTE — Telephone Encounter (Signed)
Left message to return call 

## 2017-12-05 NOTE — Telephone Encounter (Signed)
Appointment made for 12/15/17 at 3:40 pm.

## 2017-12-05 NOTE — Telephone Encounter (Signed)
Had chest pain at 3 o'clock this morning but she took a nitro and it went away-wants to know what she needs to do now

## 2017-12-05 NOTE — Telephone Encounter (Signed)
Yes see me no known CAD

## 2017-12-05 NOTE — Telephone Encounter (Signed)
Patient had sever chest pain about 3 am this morning. Patient took an 8 hour arthritis reliever, some form of Tylenol. The pain went away. Patient is not currently having any chest pain. Patient wants to know if she should make an appointment to see you. Patient does not have nitroglycerin. Please advise.

## 2017-12-12 DIAGNOSIS — I110 Hypertensive heart disease with heart failure: Secondary | ICD-10-CM | POA: Diagnosis not present

## 2017-12-12 DIAGNOSIS — M2042 Other hammer toe(s) (acquired), left foot: Secondary | ICD-10-CM | POA: Diagnosis not present

## 2017-12-12 DIAGNOSIS — I447 Left bundle-branch block, unspecified: Secondary | ICD-10-CM | POA: Diagnosis not present

## 2017-12-12 DIAGNOSIS — M2041 Other hammer toe(s) (acquired), right foot: Secondary | ICD-10-CM | POA: Diagnosis not present

## 2017-12-12 DIAGNOSIS — I482 Chronic atrial fibrillation: Secondary | ICD-10-CM | POA: Diagnosis not present

## 2017-12-12 DIAGNOSIS — R072 Precordial pain: Secondary | ICD-10-CM | POA: Diagnosis not present

## 2017-12-12 DIAGNOSIS — E782 Mixed hyperlipidemia: Secondary | ICD-10-CM | POA: Diagnosis not present

## 2017-12-12 DIAGNOSIS — I5032 Chronic diastolic (congestive) heart failure: Secondary | ICD-10-CM | POA: Diagnosis not present

## 2017-12-12 DIAGNOSIS — R079 Chest pain, unspecified: Secondary | ICD-10-CM | POA: Insufficient documentation

## 2017-12-12 DIAGNOSIS — I10 Essential (primary) hypertension: Secondary | ICD-10-CM | POA: Diagnosis not present

## 2017-12-12 DIAGNOSIS — R7303 Prediabetes: Secondary | ICD-10-CM | POA: Diagnosis not present

## 2017-12-12 DIAGNOSIS — I493 Ventricular premature depolarization: Secondary | ICD-10-CM | POA: Diagnosis not present

## 2017-12-12 HISTORY — DX: Chest pain, unspecified: R07.9

## 2017-12-13 ENCOUNTER — Other Ambulatory Visit: Payer: Self-pay

## 2017-12-13 DIAGNOSIS — R7989 Other specified abnormal findings of blood chemistry: Secondary | ICD-10-CM | POA: Insufficient documentation

## 2017-12-13 DIAGNOSIS — R945 Abnormal results of liver function studies: Secondary | ICD-10-CM

## 2017-12-13 HISTORY — DX: Other specified abnormal findings of blood chemistry: R79.89

## 2017-12-13 MED ORDER — FUROSEMIDE 40 MG PO TABS: ORAL_TABLET | ORAL | 2 refills | Status: DC
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
## 2017-12-15 ENCOUNTER — Encounter: Payer: Self-pay | Admitting: Cardiology

## 2017-12-15 ENCOUNTER — Ambulatory Visit (INDEPENDENT_AMBULATORY_CARE_PROVIDER_SITE_OTHER): Payer: Medicare HMO | Admitting: Cardiology

## 2017-12-15 VITALS — BP 132/80 | HR 83 | Ht 64.0 in | Wt 182.4 lb

## 2017-12-15 DIAGNOSIS — I482 Chronic atrial fibrillation, unspecified: Secondary | ICD-10-CM

## 2017-12-15 DIAGNOSIS — Z950 Presence of cardiac pacemaker: Secondary | ICD-10-CM

## 2017-12-15 DIAGNOSIS — I110 Hypertensive heart disease with heart failure: Secondary | ICD-10-CM

## 2017-12-15 DIAGNOSIS — R079 Chest pain, unspecified: Secondary | ICD-10-CM | POA: Diagnosis not present

## 2017-12-15 DIAGNOSIS — I5032 Chronic diastolic (congestive) heart failure: Secondary | ICD-10-CM

## 2017-12-15 DIAGNOSIS — Z7901 Long term (current) use of anticoagulants: Secondary | ICD-10-CM | POA: Diagnosis not present

## 2017-12-15 MED ORDER — NITROGLYCERIN 0.4 MG SL SUBL: 0.4000 mg | SUBLINGUAL_TABLET | SUBLINGUAL | 11 refills | Status: AC | PRN

## 2017-12-15 NOTE — Patient Instructions (Addendum)
Nitroglycerin sublingual powder What is this medicine? NITROGLYCERIN (nye troe GLI ser in) is a type of vasodilator. It relaxes blood vessels, increasing the blood and oxygen supply to your heart. This medicine is used to prevent or relieve chest pain caused by angina. It is also used to prevent chest pain before activities like climbing stairs, going outdoors in cold weather, or sexual activity. This medicine may be used for other purposes; ask your health care provider or pharmacist if you have questions. COMMON BRAND NAME(S): GONITRO What should I tell my health care provider before I take this medicine? They need to know if you have any of these conditions: -anemia -bleeding in the brain -head injury -heart disease -liver disease -an unusual or allergic reaction to nitroglycerin, other medicines, foods, dyes, or preservatives -pregnant or trying to get pregnant -breast-feeding How should I use this medicine? This medicine is only for use in the mouth. Follow the directions on the prescription label. Hold the packet upright with the notch and red arrow line at the top of the packet. Tap the bottom of the packet so the powder settles at the bottom. Hold the packet at the notch and hold as close to your mouth as possible. Tear along the red arrow line. Lift up your tongue. Pour all of the powder in the packet under your tongue. Close your mouth tight right away and breathe normally through your nose. Allow the powder to dissolve before you swallow. Do not rinse your mouth or spit for 5 minutes after taking this medicine. You can repeat with 1 packet every 5 minutes for up to 3 packets. If you do not feel better after 3 packets, call 911 immediately. Do not take your medicine more often than directed. If you take this medicine often to relieve symptoms of angina, your doctor or health care professional may provide you with different instructions to manage your symptoms. If symptoms do not go away  after following these instructions, call 911 immediately. Do not take more than 3 packets over 15 minutes. Talk to your pediatrician regarding the use of this medicine in children. Special care may be needed. Overdosage: If you think you have taken too much of this medicine contact a poison control center or emergency room at once. NOTE: This medicine is only for you. Do not share this medicine with others. What if I miss a dose? This does not apply; this medicine is not for regular use. What may interact with this medicine? Do not take this medicine with any of the following medications: -certain medicines for erectile dysfunction like avanafil, sildenafil, tadalafil, and vardenafil -ergot alkaloids like dihydroergotamine, ergonovine, ergotamine, methylergonovine -riociguat This medicine may also interact with the following medications: -certain medicines for blood pressure This list may not describe all possible interactions. Give your health care provider a list of all the medicines, herbs, non-prescription drugs, or dietary supplements you use. Also tell them if you smoke, drink alcohol, or use illegal drugs. Some items may interact with your medicine. What should I watch for while using this medicine? Tell your doctor or health care professional if your symptoms do not start to get better or if you feel worse. Keep this medicine with you at all times. Sit down when you take your medicine to prevent falling if you feel dizzy or faint after using it. Try to remain calm. This will help you to feel better faster. If you feel dizzy, take several deep breaths and lie down with your feet  propped up, or bend forward with your head resting between your knees. You may get drowsy or dizzy. Do not drive, use machinery, or do anything that needs mental alertness until you know how this drug affects you. Do not stand or sit up quickly, especially if you are an older patient. This reduces the risk of dizzy or  fainting spells. Do not treat yourself for coughs, colds, or pain while you are taking this medicine without asking your doctor or health care professional for advice. Some ingredients may increase your blood pressure. What side effects may I notice from receiving this medicine? Side effects that you should report to your doctor or health care professional as soon as possible: -allergic reactions like skin rash, itching or hives, swelling of the face, lips, or tongue -signs and symptoms of anemia like breathing problems; dizziness; unusually weak or tired -signs and symptoms of low blood pressure like dizziness; feeling faint or lightheaded; falls; unusually weak or tired Side effects that usually do not require medical attention (report to your doctor or health care professional if they continue or are bothersome): -facial flushing -headache -nausea, vomiting This list may not describe all possible side effects. Call your doctor for medical advice about side effects. You may report side effects to FDA at 1-800-FDA-1088. Where should I keep my medicine? Keep out of the reach of children. Store between 5 and 40 degrees C (41 and 104 degrees F). Throw away any unused medicine after the expiration date. NOTE: This sheet is a summary. It may not cover all possible information. If you have questions about this medicine, talk to your doctor, pharmacist, or health care provider.  2018 Elsevier/Gold Standard (2015-10-14 17:46:23)   Medication Instructions:  Your physician has recommended you make the following change in your medication:  START nitroglycerin as needed. If you start having chest pain, stop what you are doing and sit down. Put one nitroglycerin under your tongue, let it dissolve, and wait 5 mintues. If you are still having chest pain, you can do this two more times. After the third nitroglycerin, call 911.   Labwork: Your physician recommends that you return for lab work today:  troponin, CBC.   Testing/Procedures: None  Follow-Up: Your physician wants you to follow-up in: 6 months. You will receive a reminder letter in the mail two months in advance. If you don't receive a letter, please call our office to schedule the follow-up appointment.   Any Other Special Instructions Will Be Listed Below (If Applicable).     If you need a refill on your cardiac medications before your next appointment, please call your pharmacy.

## 2017-12-18 ENCOUNTER — Telehealth: Payer: Self-pay | Admitting: Cardiology

## 2017-12-18 DIAGNOSIS — I110 Hypertensive heart disease with heart failure: Secondary | ICD-10-CM | POA: Diagnosis not present

## 2017-12-18 DIAGNOSIS — Z950 Presence of cardiac pacemaker: Secondary | ICD-10-CM | POA: Diagnosis not present

## 2017-12-18 DIAGNOSIS — I5032 Chronic diastolic (congestive) heart failure: Secondary | ICD-10-CM | POA: Diagnosis not present

## 2017-12-18 DIAGNOSIS — R079 Chest pain, unspecified: Secondary | ICD-10-CM | POA: Diagnosis not present

## 2017-12-18 DIAGNOSIS — Z7901 Long term (current) use of anticoagulants: Secondary | ICD-10-CM | POA: Diagnosis not present

## 2017-12-18 DIAGNOSIS — I482 Chronic atrial fibrillation: Secondary | ICD-10-CM | POA: Diagnosis not present

## 2017-12-18 NOTE — Telephone Encounter (Signed)
Returning your call. °

## 2017-12-19 NOTE — Telephone Encounter (Signed)
Patient informed of results of lab work. 

## 2017-12-20 ENCOUNTER — Telehealth: Payer: Self-pay | Admitting: Physician Assistant

## 2017-12-20 DIAGNOSIS — I441 Atrioventricular block, second degree: Secondary | ICD-10-CM | POA: Diagnosis not present

## 2017-12-20 DIAGNOSIS — Z45018 Encounter for adjustment and management of other part of cardiac pacemaker: Secondary | ICD-10-CM | POA: Diagnosis not present

## 2017-12-20 DIAGNOSIS — R079 Chest pain, unspecified: Secondary | ICD-10-CM

## 2017-12-20 LAB — CBC
HEMATOCRIT: 39.5 % (ref 34.0–46.6)
Hemoglobin: 13.7 g/dL (ref 11.1–15.9)
MCH: 30.2 pg (ref 26.6–33.0)
MCHC: 34.7 g/dL (ref 31.5–35.7)
MCV: 87 fL (ref 79–97)
Platelets: 231 10*3/uL (ref 150–379)
RBC: 4.53 x10E6/uL (ref 3.77–5.28)
RDW: 14.5 % (ref 12.3–15.4)
WBC: 3.7 10*3/uL (ref 3.4–10.8)

## 2017-12-20 LAB — TROPONIN I: Troponin I: 0.05 ng/mL (ref 0.00–0.04)

## 2017-12-20 NOTE — Telephone Encounter (Signed)
Left message to return call 

## 2017-12-20 NOTE — Telephone Encounter (Signed)
Paged by Patience of [facility] at 6:06 AM this morning regarding trop I of 0.05 that was drawn on 12/18/2017. Phone # [phone]. The lab result given by French Anaracy of [facility]. Per French Anaracy, normal range for trop I at [facility] is 0.0 - 0.04.   Upon confirmation it appears the CBC that was drawn 2 days ago was run STAT, however [facility] could not run the Troponin I STAT and the sample was [facility] and just came back this morning.   Based on office note, there appears to be a single episode of typical angina a week prior to the office visit which prompted this troponin. Will discuss with on-call physician regarding what to do for this troponin. Since chest pain occurred a week prior to lab drawn, difficult to say if this is a trop that came down. As mentioned in the office note, if chest pain recur, will need ischemic workup.   Ramond DialSigned, Matthan Sledge PA Pager: [phone]

## 2017-12-20 NOTE — Telephone Encounter (Signed)
Minimally elevated troponin, go th [facility] lab stat Troponin call to office

## 2017-12-20 NOTE — Telephone Encounter (Signed)
Ad[facility] to [facility]. Advised patient she will wait in lobby until results received and addressed by Dr. Dulce SellarMunley. Patient verbalized understanding. No further questions.

## 2017-12-20 NOTE — Addendum Note (Signed)
Addended by: Sharene ButtersWELLS, Ladonya Jerkins E on: 12/20/2017 04:38 PM   Modules accepted: Orders

## 2017-12-25 ENCOUNTER — Telehealth: Payer: Self-pay | Admitting: Cardiology

## 2017-12-25 NOTE — Telephone Encounter (Signed)
Wants samples of Pradaxa. 

## 2017-12-25 NOTE — Telephone Encounter (Signed)
Patient advised samples left at front desk for her.

## 2017-12-28 DIAGNOSIS — M2042 Other hammer toe(s) (acquired), left foot: Secondary | ICD-10-CM | POA: Diagnosis not present

## 2017-12-28 DIAGNOSIS — B351 Tinea unguium: Secondary | ICD-10-CM | POA: Diagnosis not present

## 2017-12-28 DIAGNOSIS — M2041 Other hammer toe(s) (acquired), right foot: Secondary | ICD-10-CM | POA: Diagnosis not present

## 2018-01-10 ENCOUNTER — Ambulatory Visit (INDEPENDENT_AMBULATORY_CARE_PROVIDER_SITE_OTHER): Payer: Medicare HMO | Admitting: *Deleted

## 2018-01-10 DIAGNOSIS — I441 Atrioventricular block, second degree: Secondary | ICD-10-CM | POA: Diagnosis not present

## 2018-01-11 ENCOUNTER — Encounter: Payer: Self-pay | Admitting: Cardiology

## 2018-01-11 NOTE — Progress Notes (Signed)
Remote pacemaker transmission.   

## 2018-01-15 DIAGNOSIS — R945 Abnormal results of liver function studies: Secondary | ICD-10-CM | POA: Diagnosis not present

## 2018-01-16 LAB — CUP PACEART REMOTE DEVICE CHECK
Battery Remaining Longevity: 121 mo
Brady Statistic AP VS Percent: 1 %
Brady Statistic AS VP Percent: 32 %
Brady Statistic AS VS Percent: 1 %
Implantable Lead Implant Date: 20170215
Implantable Lead Implant Date: 20170215
Implantable Lead Location: 753859
Lead Channel Impedance Value: 400 Ohm
Lead Channel Pacing Threshold Amplitude: 0.5 V
Lead Channel Pacing Threshold Pulse Width: 0.4 ms
Lead Channel Sensing Intrinsic Amplitude: 12 mV
Lead Channel Sensing Intrinsic Amplitude: 4.2 mV
Lead Channel Setting Pacing Amplitude: 0.75 V
Lead Channel Setting Pacing Amplitude: 2 V
Lead Channel Setting Pacing Pulse Width: 0.4 ms
Lead Channel Setting Sensing Sensitivity: 2 mV
MDC IDC LEAD LOCATION: 753860
MDC IDC MSMT BATTERY REMAINING PERCENTAGE: 95.5 %
MDC IDC MSMT BATTERY VOLTAGE: 2.99 V
MDC IDC MSMT LEADCHNL RA PACING THRESHOLD AMPLITUDE: 0.5 V
MDC IDC MSMT LEADCHNL RA PACING THRESHOLD PULSEWIDTH: 0.4 ms
MDC IDC MSMT LEADCHNL RV IMPEDANCE VALUE: 540 Ohm
MDC IDC PG IMPLANT DT: 20170215
MDC IDC SESS DTM: 20190414222722
MDC IDC STAT BRADY AP VP PERCENT: 68 %
MDC IDC STAT BRADY RA PERCENT PACED: 66 %
MDC IDC STAT BRADY RV PERCENT PACED: 99 %
Pulse Gen Serial Number: 7863758

## 2018-01-24 DIAGNOSIS — K8020 Calculus of gallbladder without cholecystitis without obstruction: Secondary | ICD-10-CM | POA: Diagnosis not present

## 2018-01-24 DIAGNOSIS — R945 Abnormal results of liver function studies: Secondary | ICD-10-CM | POA: Diagnosis not present

## 2018-01-24 DIAGNOSIS — K760 Fatty (change of) liver, not elsewhere classified: Secondary | ICD-10-CM | POA: Diagnosis not present

## 2018-01-25 DIAGNOSIS — R945 Abnormal results of liver function studies: Secondary | ICD-10-CM | POA: Diagnosis not present

## 2018-01-25 DIAGNOSIS — Z1159 Encounter for screening for other viral diseases: Secondary | ICD-10-CM | POA: Diagnosis not present

## 2018-04-11 ENCOUNTER — Ambulatory Visit (INDEPENDENT_AMBULATORY_CARE_PROVIDER_SITE_OTHER): Payer: Medicare HMO | Admitting: *Deleted

## 2018-04-11 DIAGNOSIS — I441 Atrioventricular block, second degree: Secondary | ICD-10-CM

## 2018-04-11 NOTE — Progress Notes (Signed)
Remote pacemaker transmission.   

## 2018-04-12 ENCOUNTER — Encounter: Payer: Self-pay | Admitting: Cardiology

## 2018-04-22 LAB — CUP PACEART REMOTE DEVICE CHECK
Battery Voltage: 2.99 V
Brady Statistic AP VP Percent: 63 %
Brady Statistic RA Percent Paced: 62 %
Brady Statistic RV Percent Paced: 99 %
Implantable Lead Implant Date: 20170215
Implantable Lead Location: 753860
Implantable Pulse Generator Implant Date: 20170215
Lead Channel Impedance Value: 540 Ohm
Lead Channel Pacing Threshold Amplitude: 0.5 V
Lead Channel Pacing Threshold Pulse Width: 0.4 ms
Lead Channel Setting Sensing Sensitivity: 2 mV
MDC IDC LEAD IMPLANT DT: 20170215
MDC IDC LEAD LOCATION: 753859
MDC IDC MSMT BATTERY REMAINING LONGEVITY: 122 mo
MDC IDC MSMT BATTERY REMAINING PERCENTAGE: 95.5 %
MDC IDC MSMT LEADCHNL RA IMPEDANCE VALUE: 400 Ohm
MDC IDC MSMT LEADCHNL RA SENSING INTR AMPL: 5 mV
MDC IDC MSMT LEADCHNL RV PACING THRESHOLD AMPLITUDE: 0.5 V
MDC IDC MSMT LEADCHNL RV PACING THRESHOLD PULSEWIDTH: 0.4 ms
MDC IDC MSMT LEADCHNL RV SENSING INTR AMPL: 12 mV
MDC IDC PG SERIAL: 7863758
MDC IDC SESS DTM: 20190717143241
MDC IDC SET LEADCHNL RA PACING AMPLITUDE: 2 V
MDC IDC SET LEADCHNL RV PACING AMPLITUDE: 0.75 V
MDC IDC SET LEADCHNL RV PACING PULSEWIDTH: 0.4 ms
MDC IDC STAT BRADY AP VS PERCENT: 1 %
MDC IDC STAT BRADY AS VP PERCENT: 37 %
MDC IDC STAT BRADY AS VS PERCENT: 1 %
Pulse Gen Model: 2240

## 2018-05-17 DIAGNOSIS — H353131 Nonexudative age-related macular degeneration, bilateral, early dry stage: Secondary | ICD-10-CM | POA: Diagnosis not present

## 2018-05-17 DIAGNOSIS — H26493 Other secondary cataract, bilateral: Secondary | ICD-10-CM | POA: Diagnosis not present

## 2018-06-12 DIAGNOSIS — R945 Abnormal results of liver function studies: Secondary | ICD-10-CM | POA: Diagnosis not present

## 2018-06-12 DIAGNOSIS — R7303 Prediabetes: Secondary | ICD-10-CM | POA: Diagnosis not present

## 2018-06-12 DIAGNOSIS — Z23 Encounter for immunization: Secondary | ICD-10-CM | POA: Diagnosis not present

## 2018-06-12 DIAGNOSIS — E782 Mixed hyperlipidemia: Secondary | ICD-10-CM | POA: Diagnosis not present

## 2018-06-12 DIAGNOSIS — I10 Essential (primary) hypertension: Secondary | ICD-10-CM | POA: Diagnosis not present

## 2018-06-27 ENCOUNTER — Ambulatory Visit: Payer: Medicare HMO | Admitting: Cardiology

## 2018-06-28 DIAGNOSIS — B351 Tinea unguium: Secondary | ICD-10-CM | POA: Diagnosis not present

## 2018-07-11 ENCOUNTER — Ambulatory Visit (INDEPENDENT_AMBULATORY_CARE_PROVIDER_SITE_OTHER): Payer: Medicare HMO | Admitting: *Deleted

## 2018-07-11 ENCOUNTER — Telehealth: Payer: Self-pay | Admitting: Cardiology

## 2018-07-11 DIAGNOSIS — I441 Atrioventricular block, second degree: Secondary | ICD-10-CM | POA: Diagnosis not present

## 2018-07-11 NOTE — Telephone Encounter (Signed)
Spoke with pt and reminded pt of remote transmission that is due today. Pt verbalized understanding.   

## 2018-07-12 NOTE — Progress Notes (Signed)
Remote pacemaker transmission.   

## 2018-07-13 ENCOUNTER — Encounter: Payer: Self-pay | Admitting: Cardiology
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
## 2018-07-16 ENCOUNTER — Encounter: Payer: Self-pay | Admitting: Cardiology

## 2018-07-16 ENCOUNTER — Ambulatory Visit (INDEPENDENT_AMBULATORY_CARE_PROVIDER_SITE_OTHER): Payer: Medicare HMO | Admitting: Cardiology

## 2018-07-16 VITALS — BP 118/64 | HR 50 | Ht 64.0 in | Wt 183.0 lb

## 2018-07-16 DIAGNOSIS — I340 Nonrheumatic mitral (valve) insufficiency: Secondary | ICD-10-CM

## 2018-07-16 DIAGNOSIS — Z7901 Long term (current) use of anticoagulants: Secondary | ICD-10-CM | POA: Diagnosis not present

## 2018-07-16 DIAGNOSIS — I482 Chronic atrial fibrillation, unspecified: Secondary | ICD-10-CM | POA: Diagnosis not present

## 2018-07-16 DIAGNOSIS — Z950 Presence of cardiac pacemaker: Secondary | ICD-10-CM | POA: Diagnosis not present

## 2018-07-16 DIAGNOSIS — I5032 Chronic diastolic (congestive) heart failure: Secondary | ICD-10-CM

## 2018-07-16 DIAGNOSIS — I447 Left bundle-branch block, unspecified: Secondary | ICD-10-CM

## 2018-07-16 NOTE — Patient Instructions (Signed)
Medication Instructions:  Your physician recommends that you continue on your current medications as directed. Please refer to the Current Medication list given to you today.  If you need a refill on your cardiac medications before your next appointment, please call your pharmacy.   Lab work: Your physician recommends that you return for lab work today: BMP, ProBNP.   If you have labs (blood work) drawn today and your tests are completely normal, you will receive your results only by: . MyChart Message (if you have MyChart) OR . A paper copy in the mail If you have any lab test that is abnormal or we need to change your treatment, we will call you to review the results.  Testing/Procedures: None  Follow-Up: At [facility], you and your health needs are our priority.  As part of our continuing mission to provide you with exceptional heart care, we have created designated Provider Care Teams.  These Care Teams include your primary Cardiologist (physician) and Advanced Practice Providers (APPs -  Physician Assistants and Nurse Practitioners) who all work together to provide you with the care you need, when you need it. You will need a follow up appointment in 6 months.  Please call our office 2 months in advance to schedule this appointment.    

## 2018-07-17 LAB — BASIC METABOLIC PANEL
BUN/Creatinine Ratio: 23 (ref 12–28)
BUN: 15 mg/dL (ref 8–27)
CO2: 26 mmol/L (ref 20–29)
Calcium: 10.5 mg/dL — ABNORMAL HIGH (ref 8.7–10.3)
Chloride: 102 mmol/L (ref 96–106)
Creatinine, Ser: 0.66 mg/dL (ref 0.57–1.00)
GFR calc non Af Amer: 85 mL/min/{1.73_m2} (ref >59)
GFR, EST AFRICAN AMERICAN: 98 mL/min/{1.73_m2} (ref >59)
Glucose: 91 mg/dL (ref 65–99)
POTASSIUM: 4.3 mmol/L (ref 3.5–5.2)
SODIUM: 142 mmol/L (ref 134–144)

## 2018-07-17 LAB — PRO B NATRIURETIC PEPTIDE: NT-Pro BNP: 796 pg/mL — ABNORMAL HIGH (ref 0–738)

## 2018-09-15 LAB — CUP PACEART REMOTE DEVICE CHECK
Battery Remaining Longevity: 122 mo
Brady Statistic AP VP Percent: 63 %
Brady Statistic AP VS Percent: 1 %
Brady Statistic AS VP Percent: 37 %
Brady Statistic RA Percent Paced: 62 %
Date Time Interrogation Session: 20191016151152
Implantable Lead Implant Date: 20170215
Implantable Lead Location: 753859
Implantable Lead Location: 753860
Lead Channel Impedance Value: 390 Ohm
Lead Channel Impedance Value: 540 Ohm
Lead Channel Pacing Threshold Amplitude: 0.625 V
Lead Channel Pacing Threshold Pulse Width: 0.4 ms
Lead Channel Sensing Intrinsic Amplitude: 12 mV
Lead Channel Setting Pacing Amplitude: 0.875
Lead Channel Setting Pacing Amplitude: 2 V
Lead Channel Setting Pacing Pulse Width: 0.4 ms
MDC IDC LEAD IMPLANT DT: 20170215
MDC IDC MSMT BATTERY REMAINING PERCENTAGE: 95.5 %
MDC IDC MSMT BATTERY VOLTAGE: 2.99 V
MDC IDC MSMT LEADCHNL RA PACING THRESHOLD AMPLITUDE: 0.5 V
MDC IDC MSMT LEADCHNL RA SENSING INTR AMPL: 4.2 mV
MDC IDC MSMT LEADCHNL RV PACING THRESHOLD PULSEWIDTH: 0.4 ms
MDC IDC PG IMPLANT DT: 20170215
MDC IDC SET LEADCHNL RV SENSING SENSITIVITY: 2 mV
MDC IDC STAT BRADY AS VS PERCENT: 1 %
MDC IDC STAT BRADY RV PERCENT PACED: 99 %
Pulse Gen Serial Number: 7863758

## 2018-10-12 ENCOUNTER — Encounter: Payer: Self-pay | Admitting: Cardiology

## 2018-10-15 ENCOUNTER — Ambulatory Visit (INDEPENDENT_AMBULATORY_CARE_PROVIDER_SITE_OTHER): Payer: Medicare HMO

## 2018-10-15 DIAGNOSIS — I441 Atrioventricular block, second degree: Secondary | ICD-10-CM | POA: Diagnosis not present

## 2018-10-16 NOTE — Progress Notes (Signed)
Remote pacemaker transmission.   

## 2018-10-17 LAB — CUP PACEART REMOTE DEVICE CHECK
Battery Remaining Longevity: 123 mo
Brady Statistic AP VS Percent: 1 %
Brady Statistic AS VP Percent: 39 %
Implantable Lead Implant Date: 20170215
Implantable Lead Location: 753859
Lead Channel Impedance Value: 400 Ohm
Lead Channel Pacing Threshold Amplitude: 0.625 V
Lead Channel Sensing Intrinsic Amplitude: 12 mV
Lead Channel Sensing Intrinsic Amplitude: 4.6 mV
Lead Channel Setting Pacing Amplitude: 0.875
Lead Channel Setting Pacing Amplitude: 2 V
Lead Channel Setting Pacing Pulse Width: 0.4 ms
MDC IDC LEAD IMPLANT DT: 20170215
MDC IDC LEAD LOCATION: 753860
MDC IDC MSMT BATTERY REMAINING PERCENTAGE: 95.5 %
MDC IDC MSMT BATTERY VOLTAGE: 2.98 V
MDC IDC MSMT LEADCHNL RA PACING THRESHOLD AMPLITUDE: 0.5 V
MDC IDC MSMT LEADCHNL RA PACING THRESHOLD PULSEWIDTH: 0.4 ms
MDC IDC MSMT LEADCHNL RV IMPEDANCE VALUE: 540 Ohm
MDC IDC MSMT LEADCHNL RV PACING THRESHOLD PULSEWIDTH: 0.4 ms
MDC IDC PG IMPLANT DT: 20170215
MDC IDC SESS DTM: 20200117223900
MDC IDC SET LEADCHNL RV SENSING SENSITIVITY: 2 mV
MDC IDC STAT BRADY AP VP PERCENT: 61 %
MDC IDC STAT BRADY AS VS PERCENT: 1 %
MDC IDC STAT BRADY RA PERCENT PACED: 60 %
MDC IDC STAT BRADY RV PERCENT PACED: 99 %
Pulse Gen Serial Number: 7863758

## 2018-11-12 DIAGNOSIS — H3530 Unspecified macular degeneration: Secondary | ICD-10-CM | POA: Diagnosis not present

## 2018-11-19 ENCOUNTER — Other Ambulatory Visit: Payer: Self-pay

## 2018-11-19 ENCOUNTER — Telehealth: Payer: Self-pay | Admitting: Cardiology

## 2018-11-19 MED ORDER — DABIGATRAN ETEXILATE MESYLATE 150 MG PO CAPS: 150.0000 mg | ORAL_CAPSULE | Freq: Two times a day (BID) | ORAL | 3 refills | Status: DC

## 2018-11-19 NOTE — Telephone Encounter (Signed)
° ° °  1. Which medications need to be refilled? (please list name of each medication and dose if known) Pradaxa 150mg  capsules and carvedilol 6/25mg  tablets  2. Which pharmacy/location (including street and city if local pharmacy) is medication to be sent to? [facility] in [place]  3. Do they need a 30 day or 90 day supply? 90

## 2018-11-20 ENCOUNTER — Other Ambulatory Visit: Payer: Self-pay

## 2018-11-20 MED ORDER — DABIGATRAN ETEXILATE MESYLATE 150 MG PO CAPS: 150.0000 mg | ORAL_CAPSULE | Freq: Two times a day (BID) | ORAL | 0 refills | Status: DC

## 2018-11-20 MED ORDER — CARVEDILOL 6.25 MG PO TABS: ORAL_TABLET | ORAL | 0 refills | Status: DC

## 2018-11-20 NOTE — Telephone Encounter (Signed)
Approved refills for pradaxa and carvedilol sent in per patient request to [facility], [place], [place].

## 2018-12-03 ENCOUNTER — Encounter: Payer: Self-pay | Admitting: *Deleted

## 2018-12-10 ENCOUNTER — Encounter: Payer: Medicare HMO | Admitting: Cardiology

## 2018-12-10 ENCOUNTER — Other Ambulatory Visit: Payer: Self-pay

## 2018-12-10 MED ORDER — DABIGATRAN ETEXILATE MESYLATE 150 MG PO CAPS
150.0000 mg | ORAL_CAPSULE | Freq: Two times a day (BID) | ORAL | 3 refills | Status: DC
Start: 2018-12-10 — End: 2019-03-04

## 2018-12-11 DIAGNOSIS — E782 Mixed hyperlipidemia: Secondary | ICD-10-CM | POA: Diagnosis not present

## 2018-12-13 ENCOUNTER — Other Ambulatory Visit: Payer: Self-pay | Admitting: Cardiology

## 2019-01-07 ENCOUNTER — Telehealth: Payer: Self-pay | Admitting: Cardiology

## 2019-01-07 NOTE — Telephone Encounter (Signed)
Called patient to check on poss Televisit and hadto leave message to call office for verbal consent.

## 2019-01-07 NOTE — Telephone Encounter (Signed)
YOUR CARDIOLOGY TEAM HAS ARRANGED FOR AN E-VISIT FOR YOUR APPOINTMENT - PLEASE REVIEW IMPORTANT INFORMATION BELOW SEVERAL DAYS PRIOR TO YOUR APPOINTMENT ° °Due to the recent COVID-19 pandemic, we are transitioning in-person office visits to tele-medicine visits in an effort to decrease unnecessary exposure to our patients and staff. Medicare and most insurances are covering these visits without a copay needed. We also encourage you to sign up for MyChart if you have not already done so. You will need a smartphone if possible. For patients that do not have this, we can still complete the visit using a regular telephone but do prefer a smartphone to enable video when possible. You may have a close family member that lives with you that can help. If possible, we also ask that you have a blood pressure cuff and scale at home to measure your blood pressure, heart rate and weight prior to your scheduled appointment. Patients with clinical needs that need an in-person evaluation and testing will still be able to come to the office if absolutely necessary. If you have any questions, feel free to call our office. ° ° ° ° ° °CONSENT FOR TELE-HEALTH VISIT - PLEASE REVIEW ° °I hereby voluntarily request, consent and authorize [facility] and its employed or contracted physicians, physician assistants, nurse practitioners or other licensed health care professionals (the Practitioner), to provide me with telemedicine health care services (the “Services") as deemed necessary by the treating Practitioner. I acknowledge and consent to receive the Services by the Practitioner via telemedicine. I understand that the telemedicine visit will involve communicating with the Practitioner through live audiovisual communication technology and the disclosure of certain medical information by electronic transmission. I acknowledge that I have been given the opportunity to request an in-person assessment or other available alternative prior  to the telemedicine visit and am voluntarily participating in the telemedicine visit. ° °I understand that I have the right to withhold or withdraw my consent to the use of telemedicine in the course of my care at any time, without affecting my right to future care or treatment, and that the Practitioner or I may terminate the telemedicine visit at any time. I understand that I have the right to inspect all information obtained and/or recorded in the course of the telemedicine visit and may receive copies of available information for a reasonable fee.  I understand that some of the potential risks of receiving the Services via telemedicine include:  °• Delay or interruption in medical evaluation due to technological equipment failure or disruption; °• Information transmitted may not be sufficient (e.g. poor resolution of images) to allow for appropriate medical decision making by the Practitioner; and/or  °• In rare instances, security protocols could fail, causing a breach of personal health information. ° °Furthermore, I acknowledge that it is my responsibility to provide information about my medical history, conditions and care that is complete and accurate to the best of my ability. I acknowledge that Practitioner's advice, recommendations, and/or decision may be based on factors not within their control, such as incomplete or inaccurate data provided by me or distortions of diagnostic images or specimens that may result from electronic transmissions. I understand that the practice of medicine is not an exact science and that Practitioner makes no warranties or guarantees regarding treatment outcomes. I acknowledge that I will receive a copy of this consent concurrently upon execution via email to the email address I last provided but may also request a printed copy by calling the office of   [facility].   ° °I understand that my insurance will be billed for this visit.  ° °I have read or had this consent read  to me. °• I understand the contents of this consent, which adequately explains the benefits and risks of the Services being provided via telemedicine.  °• I have been provided ample opportunity to ask questions regarding this consent and the Services and have had my questions answered to my satisfaction. °• I give my informed consent for the services to be provided through the use of telemedicine in my medical care ° °By participating in this telemedicine visit I agree to the above. ° °Patient gives verbal consent for televisit 01/07/2019 pp °

## 2019-01-14 ENCOUNTER — Encounter: Payer: Medicare HMO | Admitting: *Deleted

## 2019-01-14 ENCOUNTER — Other Ambulatory Visit: Payer: Self-pay

## 2019-01-15 ENCOUNTER — Telehealth: Payer: Self-pay | Admitting: Cardiology

## 2019-01-15 ENCOUNTER — Encounter: Payer: Self-pay | Admitting: Cardiology

## 2019-01-15 ENCOUNTER — Telehealth: Payer: Self-pay

## 2019-01-15 ENCOUNTER — Telehealth (INDEPENDENT_AMBULATORY_CARE_PROVIDER_SITE_OTHER): Payer: Medicare HMO | Admitting: Cardiology

## 2019-01-15 ENCOUNTER — Other Ambulatory Visit: Payer: Self-pay

## 2019-01-15 VITALS — BP 104/55 | HR 66 | Ht 64.0 in | Wt 178.0 lb

## 2019-01-15 DIAGNOSIS — E782 Mixed hyperlipidemia: Secondary | ICD-10-CM

## 2019-01-15 DIAGNOSIS — I110 Hypertensive heart disease with heart failure: Secondary | ICD-10-CM

## 2019-01-15 DIAGNOSIS — Z950 Presence of cardiac pacemaker: Secondary | ICD-10-CM

## 2019-01-15 DIAGNOSIS — Z7189 Other specified counseling: Secondary | ICD-10-CM

## 2019-01-15 DIAGNOSIS — I482 Chronic atrial fibrillation, unspecified: Secondary | ICD-10-CM

## 2019-01-15 DIAGNOSIS — I5032 Chronic diastolic (congestive) heart failure: Secondary | ICD-10-CM

## 2019-01-15 DIAGNOSIS — Z7901 Long term (current) use of anticoagulants: Secondary | ICD-10-CM

## 2019-01-15 MED ORDER — FUROSEMIDE 40 MG PO TABS: 40.0000 mg | ORAL_TABLET | Freq: Two times a day (BID) | ORAL | 0 refills | Status: DC
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
## 2019-01-15 NOTE — Telephone Encounter (Signed)
Spoke with patient to remind of missed remote transmission 

## 2019-01-15 NOTE — Telephone Encounter (Signed)
Patient is returning your call.  

## 2019-01-15 NOTE — Addendum Note (Signed)
Addended by: Crist Fat on: 01/15/2019 11:34 AM   Modules accepted: Orders

## 2019-01-15 NOTE — Patient Instructions (Addendum)
Medication Instructions:  Your physician has recommended you make the following change in your medication:   INCREASE furosemide (lasix) 40 mg: Take 1 tablet twice daily   If you need a refill on your cardiac medications before your next appointment, please call your pharmacy.   Lab work: Your physician recommends that you return for lab work in 1 month during next office visit: BMP, ProBNP.   If you have labs (blood work) drawn today and your tests are completely normal, you will receive your results only by: Marland Kitchen MyChart Message (if you have MyChart) OR . A paper copy in the mail If you have any lab test that is abnormal or we need to change your treatment, we will call you to review the results.  Testing/Procedures: None  Follow-Up: At [facility], you and your health needs are our priority.  As part of our continuing mission to provide you with exceptional heart care, we have created designated Provider Care Teams.  These Care Teams include your primary Cardiologist (physician) and Advanced Practice Providers (APPs -  Physician Assistants and Nurse Practitioners) who all work together to provide you with the care you need, when you need it. You will need a follow up appointment in 1 months: Wednesday, 02/20/2019, at 9:40 am in the [place] office.

## 2019-01-15 NOTE — Telephone Encounter (Signed)
Spoke with patient and reviewed discharge instructions from her virtual appointment with Dr. Dulce Sellar this morning. Patient verbalized understanding. No further questions.

## 2019-01-22 ENCOUNTER — Telehealth: Payer: Self-pay | Admitting: *Deleted

## 2019-01-22 ENCOUNTER — Encounter: Payer: Self-pay | Admitting: Cardiology

## 2019-01-22 NOTE — Telephone Encounter (Signed)
Realized after looking at Appt that Dr. Dulce Sellar wanted pt to come in office. Changed appt back to office and called pt back.      Cardiac Questionnaire:    Since your last visit or hospitalization:    1. Have you been having new or worsening chest pain? no   2. Have you been having new or worsening shortness of breath? One incident, okay now 3. Have you been having new or worsening leg swelling, wt gain, or increase in abdominal girth (pants fitting more tightly)? no   4. Have you had any passing out spells? no    *A YES to any of these questions would result in the appointment being kept. *If all the answers to these questions are NO, we should indicate that given the current situation regarding the worldwide coronarvirus pandemic, at the recommendation of the CDC, we are looking to limit gatherings in our waiting area, and thus will reschedule their appointment beyond four weeks from today.   _____________   COVID-19 Pre-Screening Questions:  . Do you currently have a fever? no (yes = cancel and refer to pcp for e-visit) . Have you recently travelled on a cruise, internationally, or to [place], [place], [place], [place], [place], or [place], [place] [employer]) ? no (yes = cancel, stay home, monitor symptoms, and contact pcp or initiate e-visit if symptoms develop) . Have you been in contact with someone that is currently pending confirmation of Covid19 testing or has been confirmed to have the Covid19 virus?  no (yes = cancel, stay home, away from tested individual, monitor symptoms, and contact pcp or initiate e-visit if symptoms develop) . Are you currently experiencing fatigue or cough? no (yes = pt should be prepared to have a mask placed at the time of their visit).         YOUR CARDIOLOGY TEAM HAS ARRANGED FOR AN E-VISIT FOR YOUR APPOINTMENT - PLEASE REVIEW IMPORTANT INFORMATION BELOW SEVERAL DAYS PRIOR TO YOUR APPOINTMENT  Due to the recent COVID-19 pandemic, we are transitioning in-person office  visits to tele-medicine visits in an effort to decrease unnecessary exposure to our patients, their families, and staff. These visits are billed to your insurance just like a normal visit is. We also encourage you to sign up for MyChart if you have not already done so. You will need a smartphone if possible. For patients that do not have this, we can still complete the visit using a regular telephone but do prefer a smartphone to enable video when possible. You may have a family member that lives with you that can help. If possible, we also ask that you have a blood pressure cuff and scale at home to measure your blood pressure, heart rate and weight prior to your scheduled appointment. Patients with clinical needs that need an in-person evaluation and testing will still be able to come to the office if absolutely necessary. If you have any questions, feel free to call our office.     YOUR PROVIDER WILL BE USING THE FOLLOWING PLATFORM TO COMPLETE YOUR VISIT: Staff:MyChart/Doximity/Doxy.Mo to [employer] and type in MyChart in the search bar and download the app. If Android, ask patient to go to [employer] and type in [place] in the search bar and download the app. The app is free but as with any other app downloads, your phone may require you to verify saved payment information or Apple/Android password.  - You will need to then log into the app with your MyChart  username and password, and select [facility] as your healthcare provider to link the account.  - When it is time for your visit, go to the MyChart app, find appointments, and click Begin Video Visit. Be sure to Select Allow for your device to access the Microphone and Camera for your visit. You will then be connected, and your provider will be with you shortly.  **If you have any issues connecting or need assistance, please contact MyChart service desk [phone])**  **If using a computer, in order to ensure the best quality  for your visit, you will need to use either of the following Internet Browsers: [employer] or [employer]**  . IF USING DOXIMITY or DOXY.ME - The staff will give you instructions on receiving your link to join the meeting the day of your visit.      2-3 DAYS BEFORE YOUR APPOINTMENT  You will receive a telephone call from one of our HeartCare team members - your caller ID may say "Unknown caller." If this is a video visit, we will walk you through how to get the video launched on your phone. We will remind you check your blood pressure, heart rate and weight prior to your scheduled appointment. If you have an Apple Watch or Kardia, please upload any pertinent ECG strips the day before or morning of your appointment to MyChart. Our staff will also make sure you have reviewed the consent and agree to move forward with your scheduled tele-health visit.     THE DAY OF YOUR APPOINTMENT  Approximately 15 minutes prior to your scheduled appointment, you will receive a telephone call from one of HeartCare team - your caller ID may say "Unknown caller."  Our staff will confirm medications, vital signs for the day and any symptoms you may be experiencing. Please have this information available prior to the time of visit start. It may also be helpful for you to have a pad of paper and pen handy for any instructions given during your visit. They will also walk you through joining the smartphone meeting if this is a video visit.    CONSENT FOR TELE-HEALTH VISIT - PLEASE REVIEW  I hereby voluntarily request, consent and authorize [facility] and its employed or contracted physicians, physician assistants, nurse practitioners or other licensed health care professionals (the Practitioner), to provide me with telemedicine health care services (the "Services") as deemed necessary by the treating Practitioner. I acknowledge and consent to receive the Services by the Practitioner via telemedicine. I  understand that the telemedicine visit will involve communicating with the Practitioner through live audiovisual communication technology and the disclosure of certain medical information by electronic transmission. I acknowledge that I have been given the opportunity to request an in-person assessment or other available alternative prior to the telemedicine visit and am voluntarily participating in the telemedicine visit.  I understand that I have the right to withhold or withdraw my consent to the use of telemedicine in the course of my care at any time, without affecting my right to future care or treatment, and that the Practitioner or I may terminate the telemedicine visit at any time. I understand that I have the right to inspect all information obtained and/or recorded in the course of the telemedicine visit and may receive copies of available information for a reasonable fee.  I understand that some of the potential risks of receiving the Services via telemedicine include:  Marland Kitchen Delay or interruption in medical evaluation due to technological equipment  failure or disruption; . Information transmitted may not be sufficient (e.g. poor resolution of images) to allow for appropriate medical decision making by the Practitioner; and/or  . In rare instances, security protocols could fail, causing a breach of personal health information.  Furthermore, I acknowledge that it is my responsibility to provide information about my medical history, conditions and care that is complete and accurate to the best of my ability. I acknowledge that Practitioner's advice, recommendations, and/or decision may be based on factors not within their control, such as incomplete or inaccurate data provided by me or distortions of diagnostic images or specimens that may result from electronic transmissions. I understand that the practice of medicine is not an exact science and that Practitioner makes no warranties or guarantees  regarding treatment outcomes. I acknowledge that I will receive a copy of this consent concurrently upon execution via email to the email address I last provided but may also request a printed copy by calling the office of [facility].    I understand that my insurance will be billed for this visit.   I have read or had this consent read to me. . I understand the contents of this consent, which adequately explains the benefits and risks of the Services being provided via telemedicine.  . I have been provided ample opportunity to ask questions regarding this consent and the Services and have had my questions answered to my satisfaction. . I give my informed consent for the services to be provided through the use of telemedicine in my medical care  By participating in this telemedicine visit I agree to the above. Pt gives consent to Televisit.

## 2019-01-22 NOTE — Telephone Encounter (Signed)
Pt just got her [employer] Home Monitor device and needs help installing it. Please advise.

## 2019-01-23 ENCOUNTER — Telehealth: Payer: Self-pay

## 2019-01-23 ENCOUNTER — Telehealth: Payer: Self-pay | Admitting: *Deleted

## 2019-01-23 NOTE — Telephone Encounter (Signed)
Called pt to reschedule cancelled appt from 3/16 d/t COVID-19. Pt agreeable to telephone visit, she only has a land line phone.  Scheduled for this Friday.     Virtual Visit Pre-Appointment Phone Call  "(Name), I am calling you today to discuss your upcoming appointment. We are currently trying to limit exposure to the virus that causes COVID-19 by seeing patients at home rather than in the office."  1. "What is the BEST phone number to call the day of the visit?" - include this in appointment notes  2. "Do you have or have access to (through a family member/friend) a smartphone with video capability that we can use for your visit?" a. If yes - list this number in appt notes as "cell" (if different from BEST phone #) and list the appointment type as a VIDEO visit in appointment notes b. If no - list the appointment type as a PHONE visit in appointment notes  3. Confirm consent - "In the setting of the current Covid19 crisis, you are scheduled for a (phone or video) visit with your provider on (date) at (time).  Just as we do with many in-office visits, in order for you to participate in this visit, we must obtain consent.  If you'd like, I can send this to your mychart (if signed up) or email for you to review.  Otherwise, I can obtain your verbal consent now.  All virtual visits are billed to your insurance company just like a normal visit would be.  By agreeing to a virtual visit, we'd like you to understand that the technology does not allow for your provider to perform an examination, and thus may limit your provider's ability to fully assess your condition. If your provider identifies any concerns that need to be evaluated in person, we will make arrangements to do so.  Finally, though the technology is pretty good, we cannot assure that it will always work on either your or our end, and in the setting of a video visit, we may have to convert it to a phone-only visit.  In either situation, we  cannot ensure that we have a secure connection.  Are you willing to proceed?" STAFF: Did the patient verbally acknowledge consent to telehealth visit? Document YES/NO here: YES  4. Advise patient to be prepared - "Two hours prior to your appointment, go ahead and check your blood pressure, pulse, oxygen saturation, and your weight (if you have the equipment to check those) and write them all down. When your visit starts, your provider will ask you for this information. If you have an Apple Watch or Kardia device, please plan to have heart rate information ready on the day of your appointment. Please have a pen and paper handy nearby the day of the visit as well."  5. Give patient instructions for MyChart download to smartphone OR Doximity/Doxy.me as below if video visit (depending on what platform provider is using)  6. Inform patient they will receive a phone call 15 minutes prior to their appointment time (may be from unknown caller ID) so they should be prepared to answer    TELEPHONE CALL NOTE  Felicia Arnold has been deemed a candidate for a follow-up tele-health visit to limit community exposure during the Covid-19 pandemic. I spoke with the patient via phone to ensure availability of phone/video source, confirm preferred email & phone number, and discuss instructions and expectations.  I reminded Felicia Arnold to be prepared with any vital sign and/or  heart rhythm information that could potentially be obtained via home monitoring, at the time of her visit. I reminded Felicia Arnold to expect a phone call prior to her visit.  Baird Lyons, RN 01/23/2019 9:33 AM   INSTRUCTIONS FOR DOWNLOADING THE MYCHART APP TO SMARTPHONE  - The patient must first make sure to have activated MyChart and know their login information - If Apple, go to [employer] and type in MyChart in the search bar and download the app. If Android, ask patient to go to [employer] and type in [place] in the search bar and  download the app. The app is free but as with any other app downloads, their phone may require them to verify saved payment information or Apple/Android password.  - The patient will need to then log into the app with their MyChart username and password, and select [facility] as their healthcare provider to link the account. When it is time for your visit, go to the MyChart app, find appointments, and click Begin Video Visit. Be sure to Select Allow for your device to access the Microphone and Camera for your visit. You will then be connected, and your provider will be with you shortly.  **If they have any issues connecting, or need assistance please contact MyChart service desk [phone])**  **If using a computer, in order to ensure the best quality for their visit they will need to use either of the following Internet Browsers: [employer], or Google Chrome**  IF USING DOXIMITY or DOXY.ME - The patient will receive a link just prior to their visit by text.     FULL LENGTH CONSENT FOR TELE-HEALTH VISIT   I hereby voluntarily request, consent and authorize [facility] and its employed or contracted physicians, physician assistants, nurse practitioners or other licensed health care professionals (the Practitioner), to provide me with telemedicine health care services (the "Services") as deemed necessary by the treating Practitioner. I acknowledge and consent to receive the Services by the Practitioner via telemedicine. I understand that the telemedicine visit will involve communicating with the Practitioner through live audiovisual communication technology and the disclosure of certain medical information by electronic transmission. I acknowledge that I have been given the opportunity to request an in-person assessment or other available alternative prior to the telemedicine visit and am voluntarily participating in the telemedicine visit.  I understand that I have the right to  withhold or withdraw my consent to the use of telemedicine in the course of my care at any time, without affecting my right to future care or treatment, and that the Practitioner or I may terminate the telemedicine visit at any time. I understand that I have the right to inspect all information obtained and/or recorded in the course of the telemedicine visit and may receive copies of available information for a reasonable fee.  I understand that some of the potential risks of receiving the Services via telemedicine include:  Marland Kitchen Delay or interruption in medical evaluation due to technological equipment failure or disruption; . Information transmitted may not be sufficient (e.g. poor resolution of images) to allow for appropriate medical decision making by the Practitioner; and/or  . In rare instances, security protocols could fail, causing a breach of personal health information.  Furthermore, I acknowledge that it is my responsibility to provide information about my medical history, conditions and care that is complete and accurate to the best of my ability. I acknowledge that Practitioner's advice, recommendations, and/or decision may be  based on factors not within their control, such as incomplete or inaccurate data provided by me or distortions of diagnostic images or specimens that may result from electronic transmissions. I understand that the practice of medicine is not an exact science and that Practitioner makes no warranties or guarantees regarding treatment outcomes. I acknowledge that I will receive a copy of this consent concurrently upon execution via email to the email address I last provided but may also request a printed copy by calling the office of [facility].    I understand that my insurance will be billed for this visit.   I have read or had this consent read to me. . I understand the contents of this consent, which adequately explains the benefits and risks of the Services being  provided via telemedicine.  . I have been provided ample opportunity to ask questions regarding this consent and the Services and have had my questions answered to my satisfaction. . I give my informed consent for the services to be provided through the use of telemedicine in my medical care  By participating in this telemedicine visit I agree to the above.

## 2019-01-23 NOTE — Telephone Encounter (Signed)
I tried to help the pt send a manual transmission but was unsuccessful. I gave her [employer] tech support number and my direct number to call and let me know what tech support says. The agreed to call tech support. The pt made the comment she ready to give up on the equipment and just die. I advised her not to give up. That tech support can see in the monitor whereas I can not and they can help her troubleshoot the monitor better than I can. The pt agreed to call tech support.

## 2019-01-24 NOTE — Telephone Encounter (Signed)
Received email from [employer]-- tech services was unable to troubleshoot monitor. Unsure if RF telemetry issue, or if an issue with her monitor. Recommendation is to check patient in person with industry present.  Spoke with patient. She is agreeable to a DC appointment on Monday, 01/28/19 at 2:30pm. She is aware of [facility] office address and agrees to bring her monitor with her to this appointment. Will make [facility] aware of appointment time. Pt denies questions or concerns at this time and thanked me for my call.

## 2019-01-24 NOTE — Telephone Encounter (Signed)
Pt states that Felicia Arnold is supposed to call her back about her monitor. She states if they do not get her monitor fix or send her another one soon she would like to have an in office visit to check her device.

## 2019-01-25 ENCOUNTER — Telehealth (INDEPENDENT_AMBULATORY_CARE_PROVIDER_SITE_OTHER): Payer: Medicare HMO | Admitting: Cardiology

## 2019-01-25 ENCOUNTER — Other Ambulatory Visit: Payer: Self-pay

## 2019-01-25 ENCOUNTER — Encounter: Payer: Self-pay | Admitting: Cardiology

## 2019-01-25 DIAGNOSIS — I4430 Unspecified atrioventricular block: Secondary | ICD-10-CM | POA: Diagnosis not present
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
## 2019-01-28 ENCOUNTER — Other Ambulatory Visit: Payer: Self-pay

## 2019-01-28 ENCOUNTER — Ambulatory Visit (INDEPENDENT_AMBULATORY_CARE_PROVIDER_SITE_OTHER): Payer: Medicare HMO | Admitting: *Deleted

## 2019-01-28 DIAGNOSIS — I441 Atrioventricular block, second degree: Secondary | ICD-10-CM | POA: Diagnosis not present

## 2019-01-28 DIAGNOSIS — I480 Paroxysmal atrial fibrillation: Secondary | ICD-10-CM

## 2019-01-28 LAB — CUP PACEART INCLINIC DEVICE CHECK
Battery Remaining Longevity: 121 mo
Battery Voltage: 2.99 V
Brady Statistic RA Percent Paced: 63 %
Brady Statistic RV Percent Paced: 99.79 %
Date Time Interrogation Session: 20200504160135
Implantable Lead Implant Date: 20170215
Implantable Lead Implant Date: 20170215
Implantable Lead Location: 753859
Implantable Lead Location: 753860
Implantable Pulse Generator Implant Date: 20170215
Lead Channel Impedance Value: 462.5 Ohm
Lead Channel Impedance Value: 550 Ohm
Lead Channel Pacing Threshold Amplitude: 0.5 V
Lead Channel Pacing Threshold Amplitude: 0.5 V
Lead Channel Pacing Threshold Amplitude: 0.5 V
Lead Channel Pacing Threshold Pulse Width: 0.4 ms
Lead Channel Pacing Threshold Pulse Width: 0.4 ms
Lead Channel Pacing Threshold Pulse Width: 0.4 ms
Lead Channel Sensing Intrinsic Amplitude: 5 mV
Lead Channel Setting Pacing Amplitude: 0.75 V
Lead Channel Setting Pacing Amplitude: 2 V
Lead Channel Setting Pacing Pulse Width: 0.4 ms
Lead Channel Setting Sensing Sensitivity: 2 mV
Pulse Gen Model: 2240
Pulse Gen Serial Number: 7863758

## 2019-01-28 NOTE — Progress Notes (Signed)
Pacemaker check in clinic due to RF timeout. Normal device function. Thresholds, sensing, impedances consistent with previous measurements. Device programmed to maximize longevity. <1% AT/AF burden- available EGMs show competitive pacing with some true AF, plus pradaxa; made sensor programming less aggressive (reaction time to medium, threshold to 2.0). No high ventricular rates noted. Device programmed at appropriate safety margins. AT detection rate increased to 180bpm, HVR detection increased to 175bpm, %VP alert turned off. Histogram distribution appropriate for patient activity level. Estimated longevity 10.1-10.7 years. Patient enrolled in remote follow-up, new monitor paired today. Patient education completed. Merlin on 04-29-19 and ROV with Dr. Elberta Fortis  in 1 year.

## 2019-01-29 ENCOUNTER — Ambulatory Visit (INDEPENDENT_AMBULATORY_CARE_PROVIDER_SITE_OTHER): Payer: Medicare HMO | Admitting: *Deleted

## 2019-01-29 DIAGNOSIS — I441 Atrioventricular block, second degree: Secondary | ICD-10-CM

## 2019-01-29 LAB — CUP PACEART REMOTE DEVICE CHECK
Date Time Interrogation Session: 20200505103539
Implantable Lead Implant Date: 20170215
Implantable Lead Implant Date: 20170215
Implantable Lead Location: 753859
Implantable Lead Location: 753860
Implantable Pulse Generator Implant Date: 20170215
Pulse Gen Model: 2240
Pulse Gen Serial Number: 7863758

## 2019-02-05 ENCOUNTER — Encounter: Payer: Self-pay | Admitting: Cardiology

## 2019-02-05 NOTE — Progress Notes (Signed)
Remote pacemaker transmission.   

## 2019-02-20 ENCOUNTER — Encounter: Payer: Self-pay | Admitting: Cardiology

## 2019-02-20 ENCOUNTER — Ambulatory Visit (INDEPENDENT_AMBULATORY_CARE_PROVIDER_SITE_OTHER): Payer: Medicare HMO | Admitting: Cardiology

## 2019-02-20 ENCOUNTER — Telehealth: Payer: Self-pay | Admitting: Cardiology

## 2019-02-20 ENCOUNTER — Other Ambulatory Visit: Payer: Self-pay

## 2019-02-20 VITALS — BP 138/70 | HR 70 | Temp 97.0°F | Ht 64.0 in | Wt 176.0 lb

## 2019-02-20 DIAGNOSIS — Z7901 Long term (current) use of anticoagulants: Secondary | ICD-10-CM | POA: Diagnosis not present

## 2019-02-20 DIAGNOSIS — I5032 Chronic diastolic (congestive) heart failure: Secondary | ICD-10-CM | POA: Diagnosis not present

## 2019-02-20 DIAGNOSIS — Z950 Presence of cardiac pacemaker: Secondary | ICD-10-CM

## 2019-02-20 DIAGNOSIS — E782 Mixed hyperlipidemia: Secondary | ICD-10-CM
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
## 2019-02-20 NOTE — Telephone Encounter (Signed)
Virtual Visit Pre-Appointment Phone Call  "(Name), I am calling you today to discuss your upcoming appointment. We are currently trying to limit exposure to the virus that causes COVID-19 by seeing patients at home rather than in the office."  1. "What is the BEST phone number to call the day of the visit?" - include this in appointment notes  2. Do you have or have access to (through a family member/friend) a smartphone with video capability that we can use for your visit?" a. If yes - list this number in appt notes as cell (if different from BEST phone #) and list the appointment type as a VIDEO visit in appointment notes b. If no - list the appointment type as a PHONE visit in appointment notes  3. Confirm consent - "In the setting of the current Covid19 crisis, you are scheduled for a (phone or video) visit with your provider on (date) at (time).  Just as we do with many in-office visits, in order for you to participate in this visit, we must obtain consent.  If you'd like, I can send this to your mychart (if signed up) or email for you to review.  Otherwise, I can obtain your verbal consent now.  All virtual visits are billed to your insurance company just like a normal visit would be.  By agreeing to a virtual visit, we'd like you to understand that the technology does not allow for your provider to perform an examination, and thus may limit your provider's ability to fully assess your condition. If your provider identifies any concerns that need to be evaluated in person, we will make arrangements to do so.  Finally, though the technology is pretty good, we cannot assure that it will always work on either your or our end, and in the setting of a video visit, we may have to convert it to a phone-only visit.  In either situation, we cannot ensure that we have a secure connection.  Are you willing to proceed?" STAFF: Did the patient verbally acknowledge consent to telehealth visit? Document  YES/NO here: YES  4. Advise patient to be prepared - "Two hours prior to your appointment, go ahead and check your blood pressure, pulse, oxygen saturation, and your weight (if you have the equipment to check those) and write them all down. When your visit starts, your provider will ask you for this information. If you have an Apple Watch or Kardia device, please plan to have heart rate information ready on the day of your appointment. Please have a pen and paper handy nearby the day of the visit as well."  5. Give patient instructions for MyChart download to smartphone OR Doximity/Doxy.me as below if video visit (depending on what platform provider is using)  6. Inform patient they will receive a phone call 15 minutes prior to their appointment time (may be from unknown caller ID) so they should be prepared to answer    TELEPHONE CALL NOTE  Felicia Arnold has been deemed a candidate for a follow-up tele-health visit to limit community exposure during the Covid-19 pandemic. I spoke with the patient via phone to ensure availability of phone/video source, confirm preferred email & phone number, and discuss instructions and expectations.  I reminded Felicia Arnold to be prepared with any vital sign and/or heart rhythm information that could potentially be obtained via home monitoring, at the time of her visit. I reminded Felicia Arnold to expect a phone call prior to  her visit.  Samella ParrDenise Wilson 02/20/2019 10:45 AM   INSTRUCTIONS FOR DOWNLOADING THE MYCHART APP TO SMARTPHONE  - The patient must first make sure to have activated MyChart and know their login information - If Apple, go to [employer] Store and type in MyChart in the search bar and download the app. If Android, ask patient to go to [employer] Play Store and type in [facility] [facility] in the search bar and download the app. The app is free but as with any other app downloads, their phone may require them to verify saved payment information or Apple/Android password.    - The patient will need to then log into the app with their MyChart username and password, and select [facility] as their healthcare provider to link the account. When it is time for your visit, go to the MyChart app, find appointments, and click Begin Video Visit. Be sure to Select Allow for your device to access the Microphone and Camera for your visit. You will then be connected, and your provider will be with you shortly.  **If they have any issues connecting, or need assistance please contact MyChart service desk [phone])**  **If using a computer, in order to ensure the best quality for their visit they will need to use either of the following Internet Browsers: [employer] Edge, or Google Chrome**  IF USING DOXIMITY or DOXY.ME - The patient will receive a link just prior to their visit by text.     FULL LENGTH CONSENT FOR TELE-HEALTH VISIT   I hereby voluntarily request, consent and authorize [facility] and its employed or contracted physicians, physician assistants, nurse practitioners or other licensed health care professionals (the Practitioner), to provide me with telemedicine health care services (the Services") as deemed necessary by the treating Practitioner. I acknowledge and consent to receive the Services by the Practitioner via telemedicine. I understand that the telemedicine visit will involve communicating with the Practitioner through live audiovisual communication technology and the disclosure of certain medical information by electronic transmission. I acknowledge that I have been given the opportunity to request an in-person assessment or other available alternative prior to the telemedicine visit and am voluntarily participating in the telemedicine visit.  I understand that I have the right to withhold or withdraw my consent to the use of telemedicine in the course of my care at any time, without affecting my right to future care or treatment, and that  the Practitioner or I may terminate the telemedicine visit at any time. I understand that I have the right to inspect all information obtained and/or recorded in the course of the telemedicine visit and may receive copies of available information for a reasonable fee.  I understand that some of the potential risks of receiving the Services via telemedicine include:   Delay or interruption in medical evaluation due to technological equipment failure or disruption;  Information transmitted may not be sufficient (e.g. poor resolution of images) to allow for appropriate medical decision making by the Practitioner; and/or   In rare instances, security protocols could fail, causing a breach of personal health information.  Furthermore, I acknowledge that it is my responsibility to provide information about my medical history, conditions and care that is complete and accurate to the best of my ability. I acknowledge that Practitioner's advice, recommendations, and/or decision may be based on factors not within their control, such as incomplete or inaccurate data provided by me or distortions of diagnostic images or specimens that may result from electronic transmissions. I  understand that the practice of medicine is not an exact science and that Practitioner makes no warranties or guarantees regarding treatment outcomes. I acknowledge that I will receive a copy of this consent concurrently upon execution via email to the email address I last provided but may also request a printed copy by calling the office of [place].    I understand that my insurance will be billed for this visit.   I have read or had this consent read to me.  I understand the contents of this consent, which adequately explains the benefits and risks of the Services being provided via telemedicine.   I have been provided ample opportunity to ask questions regarding this consent and the Services and have had my questions answered to  my satisfaction.  I give my informed consent for the services to be provided through the use of telemedicine in my medical care  By participating in this telemedicine visit I agree to the above.

## 2019-02-20 NOTE — Patient Instructions (Addendum)
Medication Instructions:  Your physician recommends that you continue on your current medications as directed. Please refer to the Current Medication list given to you today.  If you need a refill on your cardiac medications before your next appointment, please call your pharmacy.   Lab work: Your physician recommends that you return for lab work today: BMP, ProBNP.   If you have labs (blood work) drawn today and your tests are completely normal, you will receive your results only by: [facility] MyChart Message (if you have MyChart) OR . A paper copy in the mail If you have any lab test that is abnormal or we need to change your treatment, we will call you to review the results.  Testing/Procedures: Your physician has requested that you have an echocardiogram. Echocardiography is a painless test that uses sound waves to create images of your heart. It provides your doctor with information about the size and shape of your heart and how well your heart's chambers and valves are working. This procedure takes approximately one hour. There are no restrictions for this procedure.  Follow-Up: At [facility], you and your health needs are our priority.  As part of our continuing mission to provide you with exceptional heart care, we have created designated Provider Care Teams.  These Care Teams include your primary Cardiologist (physician) and Advanced Practice Providers (APPs -  Physician Assistants and Nurse Practitioners) who all work together to provide you with the care you need, when you need it. You will need a virtual follow up appointment in 6 weeks.     Echocardiogram An echocardiogram is a procedure that uses painless sound waves (ultrasound) to produce an image of the heart. Images from an echocardiogram can provide important information about:  Signs of coronary artery disease (CAD).  Aneurysm detection. An aneurysm is a weak or damaged part of an artery wall that bulges out from the  normal force of blood pumping through the body.  Heart size and shape. Changes in the size or shape of the heart can be associated with certain conditions, including heart failure, aneurysm, and CAD.  Heart muscle function.  Heart valve function.  Signs of a past heart attack.  Fluid buildup around the heart.  Thickening of the heart muscle.  A tumor or infectious growth around the heart valves. Tell a health care provider about:  Any allergies you have.  All medicines you are taking, including vitamins, herbs, eye drops, creams, and over-the-counter medicines.  Any blood disorders you have.  Any surgeries you have had.  Any medical conditions you have.  Whether you are pregnant or may be pregnant. What are the risks? Generally, this is a safe procedure. However, problems may occur, including:  Allergic reaction to dye (contrast) that may be used during the procedure. What happens before the procedure? No specific preparation is needed. You may eat and drink normally. What happens during the procedure?   An IV tube may be inserted into one of your veins.  You may receive contrast through this tube. A contrast is an injection that improves the quality of the pictures from your heart.  A gel will be applied to your chest.  A wand-like tool (transducer) will be moved over your chest. The gel will help to transmit the sound waves from the transducer.  The sound waves will harmlessly bounce off of your heart to allow the heart images to be captured in real-time motion. The images will be recorded on a computer. The procedure  may vary among health care providers and hospitals. What happens after the procedure?  You may return to your normal, everyday life, including diet, activities, and medicines, unless your health care provider tells you not to do that. Summary  An echocardiogram is a procedure that uses painless sound waves (ultrasound) to produce an image of the  heart.  Images from an echocardiogram can provide important information about the size and shape of your heart, heart muscle function, heart valve function, and fluid buildup around your heart.  You do not need to do anything to prepare before this procedure. You may eat and drink normally.  After the echocardiogram is completed, you may return to your normal, everyday life, unless your health care provider tells you not to do that. This information is not intended to replace advice given to you by your health care provider. Make sure you discuss any questions you have with your health care provider. Document Released: 09/09/2000 Document Revised: 10/15/2016 Document Reviewed: 10/15/2016 Elsevier Interactive Patient Education  2019 Elsevier Inc.    

## 2019-02-21 LAB — PRO B NATRIURETIC PEPTIDE: NT-Pro BNP: 856 pg/mL — ABNORMAL HIGH (ref 0–738)

## 2019-02-21 LAB — BASIC METABOLIC PANEL
BUN/Creatinine Ratio: 19 (ref 12–28)
BUN: 16 mg/dL (ref 8–27)
CO2: 28 mmol/L (ref 20–29)
Calcium: 10.6 mg/dL — ABNORMAL HIGH (ref 8.7–10.3)
Chloride: 98 mmol/L (ref 96–106)
Creatinine, Ser: 0.83 mg/dL (ref 0.57–1.00)
GFR calc Af Amer: 78 mL/min/{1.73_m2} (ref >59)
GFR calc non Af Amer: 68 mL/min/{1.73_m2} (ref >59)
Glucose: 89 mg/dL (ref 65–99)
Potassium: 4.7 mmol/L (ref 3.5–5.2)
Sodium: 142 mmol/L (ref 134–144)

## 2019-03-04 ENCOUNTER — Telehealth: Payer: Self-pay | Admitting: *Deleted

## 2019-03-04 MED ORDER — DABIGATRAN ETEXILATE MESYLATE 150 MG PO CAPS: 150.0000 mg | ORAL_CAPSULE | Freq: Two times a day (BID) | ORAL | 0 refills | Status: DC

## 2019-03-04 NOTE — Telephone Encounter (Signed)
Refill for [facility] sent to [facility] in [place] as requested.

## 2019-03-04 NOTE — Telephone Encounter (Signed)
Pt calling about refill on Pradaxa. Needs another refill. Please advise.

## 2019-03-05 ENCOUNTER — Telehealth: Payer: Self-pay | Admitting: Cardiology

## 2019-03-05 NOTE — Telephone Encounter (Signed)
Called patient to get additional information on financial issues with pradaxa. Patient states that she was enrolled in a [facility] [place] where she received her pradaxa for $50/month. This program ended in December and it was not renewed. Patient had a refill sent to her local pharmacy yesterday and it is now going to cost her $550 for a 3 month supply. Patient only has 2 days left of pradaxa at this time.   Reviewed her chart and found where she applied for patient assistance with [facility] [employer] Patient Assistance Program in 10/2018. Gave her the phone number to call the company at [phone].  Will talk with Dr. Bettina Gavia about options as well and call patient back.

## 2019-03-05 NOTE — Telephone Encounter (Signed)
Wants to know what she can take other than prodaxa because that is too expensive

## 2019-03-05 NOTE — Telephone Encounter (Signed)
Patient called back and states that she just called [employer] Patient [employer] regarding her application for pradaxa. She was informed that as of today she has been approved for patient assistance and they are expediting her medication so she does not run out. No further questions.

## 2019-03-06 ENCOUNTER — Other Ambulatory Visit: Payer: Self-pay

## 2019-03-06 MED ORDER — FUROSEMIDE 40 MG PO TABS: 40.0000 mg | ORAL_TABLET | Freq: Two times a day (BID) | ORAL | 0 refills | Status: DC

## 2019-03-06 NOTE — Telephone Encounter (Signed)
Medication ref[facility]

## 2019-03-13 ENCOUNTER — Telehealth: Payer: Self-pay | Admitting: *Deleted

## 2019-03-13 NOTE — Telephone Encounter (Signed)
Gave pt samples of Pradaxa 150 mg in [place] office #[id number], okayed by Dr. Agustin Cree

## 2019-03-21 ENCOUNTER — Ambulatory Visit (INDEPENDENT_AMBULATORY_CARE_PROVIDER_SITE_OTHER): Payer: Medicare HMO

## 2019-03-21 ENCOUNTER — Other Ambulatory Visit: Payer: Self-pay

## 2019-03-21 ENCOUNTER — Telehealth: Payer: Self-pay | Admitting: *Deleted

## 2019-03-21 DIAGNOSIS — I5032 Chronic diastolic (congestive) heart failure: Secondary | ICD-10-CM | POA: Diagnosis not present

## 2019-03-21 MED ORDER — FUROSEMIDE 40 MG PO TABS: 40.0000 mg | ORAL_TABLET | Freq: Two times a day (BID) | ORAL | 1 refills | Status: DC

## 2019-03-21 NOTE — Telephone Encounter (Signed)
Rx refill sent to pharmacy.  *STAT* If patient is at the pharmacy, call can be transferred to refill team.   1. Which medications need to be refilled? (please list name of each medication and dose if known) Furosemide 40 mg, bid  2. Which pharmacy/location (including street and city if local pharmacy) is medication tobe sent to?[facility] [place]  3. Do they need a 30 day or 90 day supply? [place]

## 2019-03-21 NOTE — Progress Notes (Signed)
2D Echocardiogram performed     03/21/19 Felicia Arnold

## 2019-03-25 ENCOUNTER — Other Ambulatory Visit: Payer: Self-pay | Admitting: Cardiology

## 2019-03-25 DIAGNOSIS — H353132 Nonexudative age-related macular degeneration, bilateral, intermediate dry stage: Secondary | ICD-10-CM | POA: Diagnosis not present

## 2019-03-25 MED ORDER — FUROSEMIDE 40 MG PO TABS: 40.0000 mg | ORAL_TABLET | Freq: Two times a day (BID) | ORAL | 0 refills | Status: DC

## 2019-03-25 NOTE — Telephone Encounter (Signed)
°*  STAT* If patient is at the pharmacy, call can be transferred to refill team.   1. Which medications need to be refilled? (please list name of each medication and dose if known) furosemide (LASIX) 40 MG tablet   2. Which pharmacy/location (including street and city if local pharmacy) is medication to be sent to?  [id number] [place] [place] - [address] [phone] (Phone) [phone] (Fax)    3. Do they need a 30 day or 90 day supply? 90 day

## 2019-03-25 NOTE — Telephone Encounter (Signed)
[facility] [place]
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
## 2019-03-27 ENCOUNTER — Other Ambulatory Visit: Payer: Self-pay

## 2019-03-27 ENCOUNTER — Encounter: Payer: Self-pay | Admitting: Cardiology

## 2019-03-27 ENCOUNTER — Telehealth (INDEPENDENT_AMBULATORY_CARE_PROVIDER_SITE_OTHER): Payer: Medicare HMO | Admitting: Cardiology

## 2019-03-27 VITALS — BP 106/60 | HR 67 | Ht 64.0 in | Wt 175.0 lb

## 2019-03-27 DIAGNOSIS — I480 Paroxysmal atrial fibrillation: Secondary | ICD-10-CM

## 2019-03-27 DIAGNOSIS — I110 Hypertensive heart disease with heart failure: Secondary | ICD-10-CM

## 2019-03-27 DIAGNOSIS — E782 Mixed hyperlipidemia: Secondary | ICD-10-CM | POA: Diagnosis not present

## 2019-03-27 DIAGNOSIS — I5032 Chronic diastolic (congestive) heart failure: Secondary | ICD-10-CM | POA: Diagnosis not present

## 2019-03-27 DIAGNOSIS — Z7901 Long term (current) use of anticoagulants: Secondary | ICD-10-CM

## 2019-03-27 DIAGNOSIS — Z950 Presence of cardiac pacemaker: Secondary | ICD-10-CM | POA: Diagnosis not present

## 2019-03-27 NOTE — Patient Instructions (Addendum)
Medication Instructions:  Your physician recommends that you continue on your current medications as directed. Please refer to the Current Medication list given to you today.  If you need a refill on your cardiac medications before your next appointment, please call your pharmacy.   Lab work: None  If you have labs (blood work) drawn today and your tests are completely normal, you will receive your results only by: [facility]land Kitchen MyChart Message (if you have MyChart) OR . A paper copy in the mail If you have any lab test that is abnormal or we need to change your treatment, we will call you to review the results.  Testing/Procedures: None  Follow-Up: At [facility], you and your health needs are our priority.  As part of our continuing mission to provide you with exceptional heart care, we have created designated Provider Care Teams.  These Care Teams include your primary Cardiologist (physician) and Advanced Practice Providers (APPs -  Physician Assistants and Nurse Practitioners) who all work together to provide you with the care you need, when you need it. You will need a follow up appointment in 3 months: Monday, 07/08/2019, at 9:20 am in the [place] office. Please arrive at 9:05 am.

## 2019-04-28 ENCOUNTER — Other Ambulatory Visit: Payer: Self-pay | Admitting: Cardiology

## 2019-04-30 ENCOUNTER — Ambulatory Visit (INDEPENDENT_AMBULATORY_CARE_PROVIDER_SITE_OTHER): Payer: Medicare HMO | Admitting: *Deleted

## 2019-04-30 DIAGNOSIS — I480 Paroxysmal atrial fibrillation: Secondary | ICD-10-CM | POA: Diagnosis not present

## 2019-04-30 LAB — CUP PACEART REMOTE DEVICE CHECK
Battery Remaining Longevity: 122 mo
Battery Remaining Percentage: 95.5 %
Battery Voltage: 2.99 V
Brady Statistic AP VP Percent: 73 %
Brady Statistic AP VS Percent: 1 %
Brady Statistic AS VP Percent: 27 %
Brady Statistic AS VS Percent: 1 %
Brady Statistic RA Percent Paced: 72 %
Brady Statistic RV Percent Paced: 99 %
Date Time Interrogation Session: 20200803060014
Implantable Lead Implant Date: 20170215
Implantable Lead Implant Date: 20170215
Implantable Lead Location: 753859
Implantable Lead Location: 753860
Implantable Pulse Generator Implant Date: 20170215
Lead Channel Impedance Value: 440 Ohm
Lead Channel Impedance Value: 550 Ohm
Lead Channel Pacing Threshold Amplitude: 0.5 V
Lead Channel Pacing Threshold Amplitude: 0.625 V
Lead Channel Pacing Threshold Pulse Width: 0.4 ms
Lead Channel Pacing Threshold Pulse Width: 0.4 ms
Lead Channel Sensing Intrinsic Amplitude: 12 mV
Lead Channel Sensing Intrinsic Amplitude: 4.2 mV
Lead Channel Setting Pacing Amplitude: 0.875
Lead Channel Setting Pacing Amplitude: 2 V
Lead Channel Setting Pacing Pulse Width: 0.4 ms
Lead Channel Setting Sensing Sensitivity: 2 mV
Pulse Gen Model: 2240
Pulse Gen Serial Number: 7863758

## 2019-05-07 ENCOUNTER — Encounter: Payer: Self-pay | Admitting: Cardiology

## 2019-05-07 NOTE — Progress Notes (Signed)
Remote pacemaker transmission.   

## 2019-07-03 DIAGNOSIS — Z23 Encounter for immunization: Secondary | ICD-10-CM | POA: Diagnosis not present

## 2019-07-08 ENCOUNTER — Ambulatory Visit: Payer: Medicare HMO | Admitting: Cardiology
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
## 2019-07-15 ENCOUNTER — Ambulatory Visit: Payer: Medicare HMO | Admitting: Cardiology

## 2019-07-19 ENCOUNTER — Ambulatory Visit (INDEPENDENT_AMBULATORY_CARE_PROVIDER_SITE_OTHER): Payer: Medicare HMO | Admitting: Family

## 2019-07-19 ENCOUNTER — Encounter: Payer: Self-pay | Admitting: Family

## 2019-07-19 ENCOUNTER — Other Ambulatory Visit: Payer: Self-pay

## 2019-07-19 VITALS — BP 136/64 | HR 61 | Ht 64.0 in | Wt 173.1 lb

## 2019-07-19 DIAGNOSIS — Z950 Presence of cardiac pacemaker: Secondary | ICD-10-CM | POA: Diagnosis not present

## 2019-07-19 DIAGNOSIS — I5032 Chronic diastolic (congestive) heart failure: Secondary | ICD-10-CM

## 2019-07-19 DIAGNOSIS — I340 Nonrheumatic mitral (valve) insufficiency: Secondary | ICD-10-CM | POA: Diagnosis not present

## 2019-07-19 DIAGNOSIS — Z7901 Long term (current) use of anticoagulants: Secondary | ICD-10-CM | POA: Diagnosis not present

## 2019-07-19 DIAGNOSIS — I480 Paroxysmal atrial fibrillation: Secondary | ICD-10-CM

## 2019-07-19 DIAGNOSIS — I10 Essential (primary) hypertension: Secondary | ICD-10-CM | POA: Diagnosis not present
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
## 2019-07-19 NOTE — Patient Instructions (Signed)
Medication Instructions:  Your physician recommends that you continue on your current medications as directed. Please refer to the Current Medication list given to you today.  *If you need a refill on your cardiac medications before your next appointment, please call your pharmacy*  Lab Work: Your physician recommends that you return for lab work today: BMP, ProBNP.   If you have labs (blood work) drawn today and your tests are completely normal, you will receive your results only by: . MyChart Message (if you have MyChart) OR . A paper copy in the mail If you have any lab test that is abnormal or we need to change your treatment, we will call you to review the results.  Testing/Procedures: You had an EKG today.   Follow-Up: At [facility], you and your health needs are our priority.  As part of our continuing mission to provide you with exceptional heart care, we have created designated Provider Care Teams.  These Care Teams include your primary Cardiologist (physician) and Advanced Practice Providers (APPs -  Physician Assistants and Nurse Practitioners) who all work together to provide you with the care you need, when you need it.  Your next appointment:   3 months  The format for your next appointment:   In Person  Provider:   Brian Munley, MD   

## 2019-07-20 ENCOUNTER — Encounter: Payer: Self-pay | Admitting: Family

## 2019-07-20 DIAGNOSIS — I10 Essential (primary) hypertension: Secondary | ICD-10-CM

## 2019-07-20 HISTORY — DX: Essential (primary) hypertension: I10

## 2019-07-20 LAB — BASIC METABOLIC PANEL
BUN/Creatinine Ratio: 17 (ref 12–28)
BUN: 12 mg/dL (ref 8–27)
CO2: 26 mmol/L (ref 20–29)
Calcium: 10.4 mg/dL — ABNORMAL HIGH (ref 8.7–10.3)
Chloride: 105 mmol/L (ref 96–106)
Creatinine, Ser: 0.69 mg/dL (ref 0.57–1.00)
GFR calc Af Amer: 96 mL/min/{1.73_m2} (ref >59)
GFR calc non Af Amer: 83 mL/min/{1.73_m2} (ref >59)
Glucose: 83 mg/dL (ref 65–99)
Potassium: 5 mmol/L (ref 3.5–5.2)
Sodium: 142 mmol/L (ref 134–144)

## 2019-07-20 LAB — PRO B NATRIURETIC PEPTIDE: NT-Pro BNP: 827 pg/mL — ABNORMAL HIGH (ref 0–738)

## 2019-07-29 LAB — CUP PACEART REMOTE DEVICE CHECK
Battery Remaining Longevity: 121 mo
Battery Remaining Percentage: 95.5 %
Battery Voltage: 2.99 V
Brady Statistic AP VP Percent: 71 %
Brady Statistic AP VS Percent: 1 %
Brady Statistic AS VP Percent: 28 %
Brady Statistic AS VS Percent: 1 %
Brady Statistic RA Percent Paced: 71 %
Brady Statistic RV Percent Paced: 99 %
Date Time Interrogation Session: 20201102060014
Implantable Lead Implant Date: 20170215
Implantable Lead Implant Date: 20170215
Implantable Lead Location: 753859
Implantable Lead Location: 753860
Implantable Pulse Generator Implant Date: 20170215
Lead Channel Impedance Value: 400 Ohm
Lead Channel Impedance Value: 510 Ohm
Lead Channel Pacing Threshold Amplitude: 0.5 V
Lead Channel Pacing Threshold Amplitude: 0.625 V
Lead Channel Pacing Threshold Pulse Width: 0.4 ms
Lead Channel Pacing Threshold Pulse Width: 0.4 ms
Lead Channel Sensing Intrinsic Amplitude: 12 mV
Lead Channel Sensing Intrinsic Amplitude: 4.8 mV
Lead Channel Setting Pacing Amplitude: 0.875
Lead Channel Setting Pacing Amplitude: 2 V
Lead Channel Setting Pacing Pulse Width: 0.4 ms
Lead Channel Setting Sensing Sensitivity: 2 mV
Pulse Gen Model: 2240
Pulse Gen Serial Number: 7863758

## 2019-07-30 ENCOUNTER — Ambulatory Visit (INDEPENDENT_AMBULATORY_CARE_PROVIDER_SITE_OTHER): Payer: Medicare HMO | Admitting: *Deleted

## 2019-07-30 ENCOUNTER — Encounter: Payer: Self-pay | Admitting: Emergency Medicine

## 2019-07-30 DIAGNOSIS — I447 Left bundle-branch block, unspecified: Secondary | ICD-10-CM

## 2019-07-30 DIAGNOSIS — I482 Chronic atrial fibrillation, unspecified: Secondary | ICD-10-CM | POA: Diagnosis not present

## 2019-08-02 ENCOUNTER — Telehealth: Payer: Self-pay | Admitting: Emergency Medicine

## 2019-08-02 NOTE — Telephone Encounter (Signed)
Called patient informed her if she would like a copy of labs she will have to come by office to sign release form for this. She doesn't wish to do this and will not be obtaining lab results at this time.

## 2019-08-19 NOTE — Progress Notes (Signed)
Remote pacemaker transmission.   

## 2019-09-09 DIAGNOSIS — I482 Chronic atrial fibrillation, unspecified: Secondary | ICD-10-CM | POA: Diagnosis not present

## 2019-09-09 DIAGNOSIS — Z0000 Encounter for general adult medical examination without abnormal findings: Secondary | ICD-10-CM | POA: Diagnosis not present

## 2019-09-09 DIAGNOSIS — E669 Obesity, unspecified: Secondary | ICD-10-CM | POA: Diagnosis not present

## 2019-09-09 DIAGNOSIS — M8580 Other specified disorders of bone density and structure, unspecified site: Secondary | ICD-10-CM | POA: Diagnosis not present

## 2019-09-09 DIAGNOSIS — R2681 Unsteadiness on feet: Secondary | ICD-10-CM | POA: Diagnosis not present

## 2019-09-09 DIAGNOSIS — I110 Hypertensive heart disease with heart failure: Secondary | ICD-10-CM | POA: Diagnosis not present

## 2019-09-09 DIAGNOSIS — D6869 Other thrombophilia: Secondary | ICD-10-CM | POA: Diagnosis not present

## 2019-09-09 DIAGNOSIS — I5032 Chronic diastolic (congestive) heart failure: Secondary | ICD-10-CM | POA: Diagnosis not present

## 2019-09-09 DIAGNOSIS — I4891 Unspecified atrial fibrillation: Secondary | ICD-10-CM | POA: Diagnosis not present

## 2019-09-09 DIAGNOSIS — E785 Hyperlipidemia, unspecified: Secondary | ICD-10-CM | POA: Diagnosis not present

## 2019-10-29 ENCOUNTER — Ambulatory Visit (INDEPENDENT_AMBULATORY_CARE_PROVIDER_SITE_OTHER): Payer: Medicare HMO | Admitting: *Deleted

## 2019-10-29 DIAGNOSIS — I482 Chronic atrial fibrillation, unspecified: Secondary | ICD-10-CM

## 2019-10-29 LAB — CUP PACEART REMOTE DEVICE CHECK
Battery Remaining Longevity: 122 mo
Battery Remaining Percentage: 95.5 %
Battery Voltage: 2.98 V
Brady Statistic AP VP Percent: 67 %
Brady Statistic AP VS Percent: 1 %
Brady Statistic AS VP Percent: 32 %
Brady Statistic AS VS Percent: 1 %
Brady Statistic RA Percent Paced: 66 %
Brady Statistic RV Percent Paced: 99 %
Date Time Interrogation Session: 20210201020012
Implantable Lead Implant Date: 20170215
Implantable Lead Implant Date: 20170215
Implantable Lead Location: 753859
Implantable Lead Location: 753860
Implantable Pulse Generator Implant Date: 20170215
Lead Channel Impedance Value: 410 Ohm
Lead Channel Impedance Value: 550 Ohm
Lead Channel Pacing Threshold Amplitude: 0.5 V
Lead Channel Pacing Threshold Amplitude: 0.5 V
Lead Channel Pacing Threshold Pulse Width: 0.4 ms
Lead Channel Pacing Threshold Pulse Width: 0.4 ms
Lead Channel Sensing Intrinsic Amplitude: 12 mV
Lead Channel Sensing Intrinsic Amplitude: 3.6 mV
Lead Channel Setting Pacing Amplitude: 0.75 V
Lead Channel Setting Pacing Amplitude: 2 V
Lead Channel Setting Pacing Pulse Width: 0.4 ms
Lead Channel Setting Sensing Sensitivity: 2 mV
Pulse Gen Model: 2240
Pulse Gen Serial Number: 7863758

## 2019-10-30 NOTE — Progress Notes (Signed)
PPM Remote  
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
## 2019-11-18 ENCOUNTER — Encounter: Payer: Self-pay | Admitting: Cardiology

## 2019-11-18 ENCOUNTER — Other Ambulatory Visit: Payer: Self-pay

## 2019-11-18 ENCOUNTER — Ambulatory Visit (INDEPENDENT_AMBULATORY_CARE_PROVIDER_SITE_OTHER): Payer: Medicare HMO | Admitting: Cardiology

## 2019-11-18 VITALS — BP 120/62 | HR 85 | Temp 97.8°F | Ht 64.0 in | Wt 181.0 lb

## 2019-11-18 DIAGNOSIS — I482 Chronic atrial fibrillation, unspecified: Secondary | ICD-10-CM

## 2019-11-18 DIAGNOSIS — Z7901 Long term (current) use of anticoagulants: Secondary | ICD-10-CM

## 2019-11-18 DIAGNOSIS — I110 Hypertensive heart disease with heart failure: Secondary | ICD-10-CM

## 2019-11-18 DIAGNOSIS — Z950 Presence of cardiac pacemaker: Secondary | ICD-10-CM | POA: Diagnosis not present

## 2019-11-18 DIAGNOSIS — I5032 Chronic diastolic (congestive) heart failure: Secondary | ICD-10-CM

## 2019-11-18 LAB — CBC
Hematocrit: 39.1 % (ref 34.0–46.6)
Hemoglobin: 13.4 g/dL (ref 11.1–15.9)
MCH: 32 pg (ref 26.6–33.0)
MCHC: 34.3 g/dL (ref 31.5–35.7)
MCV: 93 fL (ref 79–97)
Platelets: 213 10*3/uL (ref 150–450)
RBC: 4.19 x10E6/uL (ref 3.77–5.28)
RDW: 13 % (ref 11.7–15.4)
WBC: 3.6 10*3/uL (ref 3.4–10.8)

## 2019-11-18 LAB — LIPID PANEL
Chol/HDL Ratio: 2 ratio (ref 0.0–4.4)
Cholesterol, Total: 158 mg/dL (ref 100–199)
HDL: 78 mg/dL (ref >39)
LDL Chol Calc (NIH): 67 mg/dL (ref 0–99)
Triglycerides: 64 mg/dL (ref 0–149)
VLDL Cholesterol Cal: 13 mg/dL (ref 5–40)

## 2019-11-18 NOTE — Telephone Encounter (Signed)
error 

## 2019-11-18 NOTE — Patient Instructions (Addendum)
Medication Instructions:  Your physician recommends that you continue on your current medications as directed. Please refer to the Current Medication list given to you today.  *If you need a refill on your cardiac medications before your next appointment, please call your pharmacy*  Lab Work: TODAY:  BMET, CBC, LIPID, & PRO BNP  If you have labs (blood work) drawn today and your tests are completely normal, you will receive your results only by: Marland Kitchen MyChart Message (if you have MyChart) OR . A paper copy in the mail If you have any lab test that is abnormal or we need to change your treatment, we will call you to review the results.  Testing/Procedures: None ordered  Follow-Up: At [facility], you and your health needs are our priority.  As part of our continuing mission to provide you with exceptional heart care, we have created designated Provider Care Teams.  These Care Teams include your primary Cardiologist (physician) and Advanced Practice Providers (APPs -  Physician Assistants and Nurse Practitioners) who all work together to provide you with the care you need, when you need it.  Your next appointment:   6 month(s)  The format for your next appointment:   In Person  Provider:   Norman Herrlich, MD  Other Instructions

## 2019-11-19 LAB — BASIC METABOLIC PANEL
BUN/Creatinine Ratio: 22 (ref 12–28)
BUN: 15 mg/dL (ref 8–27)
CO2: 24 mmol/L (ref 20–29)
Calcium: 10.3 mg/dL (ref 8.7–10.3)
Chloride: 104 mmol/L (ref 96–106)
Creatinine, Ser: 0.69 mg/dL (ref 0.57–1.00)
GFR calc Af Amer: 96 mL/min/{1.73_m2} (ref >59)
GFR calc non Af Amer: 83 mL/min/{1.73_m2} (ref >59)
Glucose: 101 mg/dL — ABNORMAL HIGH (ref 65–99)
Potassium: 4.7 mmol/L (ref 3.5–5.2)
Sodium: 142 mmol/L (ref 134–144)

## 2019-11-19 LAB — PRO B NATRIURETIC PEPTIDE: NT-Pro BNP: 774 pg/mL — ABNORMAL HIGH (ref 0–738)

## 2019-11-21 ENCOUNTER — Other Ambulatory Visit: Payer: Self-pay | Admitting: Cardiology

## 2019-11-29 ENCOUNTER — Other Ambulatory Visit: Payer: Self-pay | Admitting: Cardiology

## 2020-01-28 ENCOUNTER — Ambulatory Visit (INDEPENDENT_AMBULATORY_CARE_PROVIDER_SITE_OTHER): Payer: Medicare HMO | Admitting: *Deleted

## 2020-01-28 DIAGNOSIS — I482 Chronic atrial fibrillation, unspecified: Secondary | ICD-10-CM

## 2020-01-28 DIAGNOSIS — I441 Atrioventricular block, second degree: Secondary | ICD-10-CM

## 2020-01-28 LAB — CUP PACEART REMOTE DEVICE CHECK
Battery Remaining Longevity: 124 mo
Battery Remaining Percentage: 95.5 %
Battery Voltage: 2.98 V
Brady Statistic AP VP Percent: 64 %
Brady Statistic AP VS Percent: 1 %
Brady Statistic AS VP Percent: 35 %
Brady Statistic AS VS Percent: 1 %
Brady Statistic RA Percent Paced: 63 %
Brady Statistic RV Percent Paced: 99 %
Date Time Interrogation Session: 20210504040016
Implantable Lead Implant Date: 20170215
Implantable Lead Implant Date: 20170215
Implantable Lead Location: 753859
Implantable Lead Location: 753860
Implantable Pulse Generator Implant Date: 20170215
Lead Channel Impedance Value: 460 Ohm
Lead Channel Impedance Value: 550 Ohm
Lead Channel Pacing Threshold Amplitude: 0.5 V
Lead Channel Pacing Threshold Amplitude: 0.5 V
Lead Channel Pacing Threshold Pulse Width: 0.4 ms
Lead Channel Pacing Threshold Pulse Width: 0.4 ms
Lead Channel Sensing Intrinsic Amplitude: 12 mV
Lead Channel Sensing Intrinsic Amplitude: 3.9 mV
Lead Channel Setting Pacing Amplitude: 0.75 V
Lead Channel Setting Pacing Amplitude: 2 V
Lead Channel Setting Pacing Pulse Width: 0.4 ms
Lead Channel Setting Sensing Sensitivity: 2 mV
Pulse Gen Model: 2240
Pulse Gen Serial Number: 7863758

## 2020-01-29 NOTE — Progress Notes (Signed)
Remote pacemaker transmission.   

## 2020-02-18 ENCOUNTER — Telehealth: Payer: Self-pay | Admitting: Cardiology

## 2020-02-18 NOTE — Telephone Encounter (Signed)
Spoke with the patient just now and let her know that Dr. Dulce Sellar recommends that she keep taking these medications. She verbalizes understanding and states that she does not need any refills at this time.    Encouraged patient to call back with any questions or concerns.

## 2020-02-18 NOTE — Telephone Encounter (Signed)
I looked at the notes there is nothing about stopping them she should continue medications and I suspect when she needs a refill.

## 2020-02-18 NOTE — Telephone Encounter (Signed)
Pt c/o medication issue:  1. Name of Medication: atorvastatin (LIPITOR) 10 MG tablet carvedilol (COREG) 6.25 MG tablet  2. How are you currently taking this medication (dosage and times per day)? Not currently taking medication   3. Are you having a reaction (difficulty breathing--STAT)? No   4. What is your medication issue? Reema is calling stating her PCP took her off these medication and stated Dr. Dulce Sellar would represcribe them if needed and she just wanted to be sure Dr. Dulce Sellar was aware. Please advise.

## 2020-02-19 NOTE — Telephone Encounter (Signed)
Yes

## 2020-03-11 ENCOUNTER — Telehealth: Payer: Self-pay

## 2020-03-11 NOTE — Telephone Encounter (Signed)
Advised patient of message below, she said she will come in to pick up the forms.

## 2020-03-11 NOTE — Telephone Encounter (Signed)
Left a detailed message on the patients voicemail letting her know that I faxed her patient assistance papers for her but the company said that they need more information. They need the papers to be filled out completely and they also need proof of her monthly income. I asked that she please come and pick up these forms up front at her earliest convenience and provide the information needed and then bring them back to [place]. I also gave her our call back number to call back with any other questions or concerns.

## 2020-03-12 ENCOUNTER — Telehealth: Payer: Self-pay | Admitting: Cardiology

## 2020-03-12 ENCOUNTER — Telehealth: Payer: Self-pay

## 2020-03-12 MED ORDER — DABIGATRAN ETEXILATE MESYLATE 150 MG PO CAPS: 150.0000 mg | ORAL_CAPSULE | Freq: Two times a day (BID) | ORAL | 3 refills | Status: DC

## 2020-03-12 NOTE — Telephone Encounter (Signed)
Spoke to the patient just now and let her know that I did fax these forms this morning for her. She verbalizes understanding and thanks me for the call.    Encouraged patient to call back with any questions or concerns.

## 2020-03-12 NOTE — Telephone Encounter (Signed)
Pt c/o medication issue:  1. Name of Medication: dabigatran (PRADAXA) 150 MG CAPS capsule  2. How are you currently taking this medication (dosage and times per day)? As directed  3. Are you having a reaction (difficulty breathing--STAT)? no  4. What is your medication issue? Patient states that she needs a prescription faxed to [employer] for patient assistance. She states her request was denied because they need a copy of the prescription. Please fax to [phone].

## 2020-03-12 NOTE — Telephone Encounter (Signed)
Telephone note started in order to print a prescription for the patients pradaxa. This was requested from the patient assistance foundation.

## 2020-03-13 ENCOUNTER — Telehealth: Payer: Self-pay

## 2020-03-13 NOTE — Telephone Encounter (Signed)
Left the patient a detailed message letting her know that I would be placing forms up front for her to come and pick up. They are from the patient assistance foundation and contain a phone number for the patient to call for further assistance through social security. I also gave her our call back number for is she has any questions.

## 2020-03-16 ENCOUNTER — Telehealth: Payer: Self-pay | Admitting: Cardiology

## 2020-03-16 NOTE — Telephone Encounter (Signed)
Spoke to the patient just now and let her know that I have put a form up front in the pick up box for her to pick up. This letter contains a number that was provided for the patient to call and get assistance through her Social Security Department. She verbalizes understanding and states that she will come by and pick this up.    Encouraged patient to call back with any questions or concerns.

## 2020-03-16 NOTE — Telephone Encounter (Signed)
Patient called to give ref # [id number]

## 2020-03-16 NOTE — Telephone Encounter (Signed)
° ° °  Pt c/o medication issue:  1. Name of Medication: dabigatran (PRADAXA) 150 MG CAPS capsule  2. How are you currently taking this medication (dosage and times per day)?   3. Are you having a reaction (difficulty breathing--STAT)?   4. What is your medication issue? Pt said she received a letter denying her for pt assistance for pradaxa. Per letter the reason is the form she filled up is not received. She said she sent it to DR. Munley, the entire form she filled it up. She wasn't sure if no one is sending it to the company  Please advise

## 2020-03-23 ENCOUNTER — Encounter: Payer: Self-pay | Admitting: Cardiology

## 2020-03-23 ENCOUNTER — Ambulatory Visit (INDEPENDENT_AMBULATORY_CARE_PROVIDER_SITE_OTHER): Payer: Medicare HMO | Admitting: Cardiology

## 2020-03-23 ENCOUNTER — Other Ambulatory Visit: Payer: Self-pay

## 2020-03-23 VITALS — BP 118/64 | HR 62 | Ht 64.0 in | Wt 175.0 lb

## 2020-03-23 DIAGNOSIS — I480 Paroxysmal atrial fibrillation: Secondary | ICD-10-CM | POA: Diagnosis not present

## 2020-03-23 DIAGNOSIS — Z950 Presence of cardiac pacemaker: Secondary | ICD-10-CM | POA: Diagnosis not present

## 2020-03-23 DIAGNOSIS — I441 Atrioventricular block, second degree: Secondary | ICD-10-CM

## 2020-03-23 LAB — CUP PACEART INCLINIC DEVICE CHECK
Battery Remaining Longevity: 120 mo
Battery Voltage: 2.98 V
Brady Statistic RA Percent Paced: 62 %
Brady Statistic RV Percent Paced: 99.4 %
Date Time Interrogation Session: 20210628115220
Implantable Lead Implant Date: 20170215
Implantable Lead Implant Date: 20170215
Implantable Lead Location: 753859
Implantable Lead Location: 753860
Implantable Pulse Generator Implant Date: 20170215
Lead Channel Impedance Value: 462.5 Ohm
Lead Channel Impedance Value: 537.5 Ohm
Lead Channel Pacing Threshold Amplitude: 0.5 V
Lead Channel Pacing Threshold Amplitude: 0.5 V
Lead Channel Pacing Threshold Pulse Width: 0.4 ms
Lead Channel Pacing Threshold Pulse Width: 0.4 ms
Lead Channel Sensing Intrinsic Amplitude: 12 mV
Lead Channel Sensing Intrinsic Amplitude: 3.9 mV
Lead Channel Setting Pacing Amplitude: 0.75 V
Lead Channel Setting Pacing Amplitude: 2 V
Lead Channel Setting Pacing Pulse Width: 0.4 ms
Lead Channel Setting Sensing Sensitivity: 2 mV
Pulse Gen Model: 2240
Pulse Gen Serial Number: 7863758
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
## 2020-03-23 NOTE — Patient Instructions (Signed)
Medication Instructions:  Your physician recommends that you continue on your current medications as directed. Please refer to the Current Medication list given to you today.  *If you need a refill on your cardiac medications before your next appointment, please call your pharmacy*   Lab Work: None ordered If you have labs (blood work) drawn today and your tests are completely normal, you will receive your results only by: Marland Kitchen MyChart Message (if you have MyChart) OR . A paper copy in the mail If you have any lab test that is abnormal or we need to change your treatment, we will call you to review the results.   Testing/Procedures: None ordered   Follow-Up: Remote monitoring is used to monitor your Pacemaker of ICD from home. This monitoring reduces the number of office visits required to check your device to one time per year. It allows [place] to keep an eye on the functioning of your device to ensure it is working properly. You are scheduled for a device check from home on 04/28/2020. You may send your transmission at any time that day. If you have a wireless device, the transmission will be sent automatically. After your physician reviews your transmission, you will receive a postcard with your next transmission date.  At [facility], you and your health needs are our priority.  As part of our continuing mission to provide you with exceptional heart care, we have created designated Provider Care Teams.  These Care Teams include your primary Cardiologist (physician) and Advanced Practice Providers (APPs -  Physician Assistants and Nurse Practitioners) who all work together to provide you with the care you need, when you need it.  We recommend signing up for the patient portal called "MyChart".  Sign up information is provided on this After Visit Summary.  MyChart is used to connect with patients for Virtual Visits (Telemedicine).  Patients are able to view lab/test results, encounter notes,  upcoming appointments, etc.  Non-urgent messages can be sent to your provider as well.   To learn more about what you can do with MyChart, go to [email or web address].    Your next appointment:   1 year(s)  The format for your next appointment:   In Person  Provider:   Loman Brooklyn, MD   Thank you for choosing [facility]!!   Dory Horn, RN [phone]    Other Instructions

## 2020-04-28 ENCOUNTER — Ambulatory Visit (INDEPENDENT_AMBULATORY_CARE_PROVIDER_SITE_OTHER): Payer: Medicare HMO | Admitting: *Deleted

## 2020-04-28 DIAGNOSIS — I441 Atrioventricular block, second degree: Secondary | ICD-10-CM

## 2020-04-28 LAB — CUP PACEART REMOTE DEVICE CHECK
Battery Remaining Longevity: 122 mo
Battery Remaining Percentage: 95.5 %
Battery Voltage: 2.98 V
Brady Statistic AP VP Percent: 64 %
Brady Statistic AP VS Percent: 1 %
Brady Statistic AS VP Percent: 36 %
Brady Statistic AS VS Percent: 1 %
Brady Statistic RA Percent Paced: 62 %
Brady Statistic RV Percent Paced: 99 %
Date Time Interrogation Session: 20210803040014
Implantable Lead Implant Date: 20170215
Implantable Lead Implant Date: 20170215
Implantable Lead Location: 753859
Implantable Lead Location: 753860
Implantable Pulse Generator Implant Date: 20170215
Lead Channel Impedance Value: 410 Ohm
Lead Channel Impedance Value: 530 Ohm
Lead Channel Pacing Threshold Amplitude: 0.5 V
Lead Channel Pacing Threshold Amplitude: 0.5 V
Lead Channel Pacing Threshold Pulse Width: 0.4 ms
Lead Channel Pacing Threshold Pulse Width: 0.4 ms
Lead Channel Sensing Intrinsic Amplitude: 12 mV
Lead Channel Sensing Intrinsic Amplitude: 4.5 mV
Lead Channel Setting Pacing Amplitude: 0.75 V
Lead Channel Setting Pacing Amplitude: 2 V
Lead Channel Setting Pacing Pulse Width: 0.4 ms
Lead Channel Setting Sensing Sensitivity: 2 mV
Pulse Gen Model: 2240
Pulse Gen Serial Number: 7863758

## 2020-04-30 NOTE — Progress Notes (Signed)
Remote pacemaker transmission.   

## 2020-06-08 ENCOUNTER — Other Ambulatory Visit: Payer: Self-pay | Admitting: Cardiology

## 2020-06-18 ENCOUNTER — Encounter: Payer: Self-pay | Admitting: Cardiology

## 2020-06-18 ENCOUNTER — Other Ambulatory Visit: Payer: Self-pay

## 2020-06-18 ENCOUNTER — Ambulatory Visit: Payer: Medicare HMO | Admitting: Cardiology

## 2020-06-18 VITALS — BP 106/62 | HR 64 | Ht 64.0 in | Wt 174.0 lb

## 2020-06-18 DIAGNOSIS — I480 Paroxysmal atrial fibrillation: Secondary | ICD-10-CM | POA: Diagnosis not present

## 2020-06-18 DIAGNOSIS — I110 Hypertensive heart disease with heart failure: Secondary | ICD-10-CM

## 2020-06-18 DIAGNOSIS — I441 Atrioventricular block, second degree: Secondary | ICD-10-CM | POA: Diagnosis not present

## 2020-06-18 DIAGNOSIS — Z7901 Long term (current) use of anticoagulants: Secondary | ICD-10-CM | POA: Diagnosis not present

## 2020-06-18 DIAGNOSIS — Z950 Presence of cardiac pacemaker: Secondary | ICD-10-CM

## 2020-06-18 DIAGNOSIS — I340 Nonrheumatic mitral (valve) insufficiency: Secondary | ICD-10-CM

## 2020-06-18 DIAGNOSIS — I5032 Chronic diastolic (congestive) heart failure: Secondary | ICD-10-CM

## 2020-06-18 LAB — COMPREHENSIVE METABOLIC PANEL
ALT: 25 IU/L (ref 0–32)
AST: 36 IU/L (ref 0–40)
Albumin/Globulin Ratio: 1.7 (ref 1.2–2.2)
Albumin: 4.1 g/dL (ref 3.7–4.7)
Alkaline Phosphatase: 90 IU/L (ref 44–121)
BUN/Creatinine Ratio: 16 (ref 12–28)
BUN: 12 mg/dL (ref 8–27)
Bilirubin Total: 0.7 mg/dL (ref 0.0–1.2)
CO2: 31 mmol/L — ABNORMAL HIGH (ref 20–29)
Calcium: 10.5 mg/dL — ABNORMAL HIGH (ref 8.7–10.3)
Chloride: 101 mmol/L (ref 96–106)
Creatinine, Ser: 0.73 mg/dL (ref 0.57–1.00)
GFR calc Af Amer: 90 mL/min/{1.73_m2} (ref >59)
GFR calc non Af Amer: 78 mL/min/{1.73_m2} (ref >59)
Globulin, Total: 2.4 g/dL (ref 1.5–4.5)
Glucose: 105 mg/dL — ABNORMAL HIGH (ref 65–99)
Potassium: 4.2 mmol/L (ref 3.5–5.2)
Sodium: 143 mmol/L (ref 134–144)
Total Protein: 6.5 g/dL (ref 6.0–8.5)

## 2020-06-18 LAB — LIPID PANEL
Chol/HDL Ratio: 2.9 ratio (ref 0.0–4.4)
Cholesterol, Total: 174 mg/dL (ref 100–199)
HDL: 61 mg/dL (ref >39)
LDL Chol Calc (NIH): 98 mg/dL (ref 0–99)
Triglycerides: 78 mg/dL (ref 0–149)
VLDL Cholesterol Cal: 15 mg/dL (ref 5–40)

## 2020-06-18 LAB — CBC
Hematocrit: 41.4 % (ref 34.0–46.6)
Hemoglobin: 13.7 g/dL (ref 11.1–15.9)
MCH: 31.7 pg (ref 26.6–33.0)
MCHC: 33.1 g/dL (ref 31.5–35.7)
MCV: 96 fL (ref 79–97)
Platelets: 219 10*3/uL (ref 150–450)
RBC: 4.32 x10E6/uL (ref 3.77–5.28)
RDW: 13.2 % (ref 11.7–15.4)
WBC: 3.8 10*3/uL (ref 3.4–10.8)

## 2020-06-18 NOTE — Patient Instructions (Signed)
Medication Instructions:  Your physician recommends that you continue on your current medications as directed. Please refer to the Current Medication list given to you today.  *If you need a refill on your cardiac medications before your next appointment, please call your pharmacy*   Lab Work: Your physician recommends that you return for lab work today: cmp, lipid, cbc   If you have labs (blood work) drawn today and your tests are completely normal, you will receive your results only by: Marland Kitchen MyChart Message (if you have MyChart) OR . A paper copy in the mail If you have any lab test that is abnormal or we need to change your treatment, we will call you to review the results.   Testing/Procedures: None.    Follow-Up: At [facility], you and your health needs are our priority.  As part of our continuing mission to provide you with exceptional heart care, we have created designated Provider Care Teams.  These Care Teams include your primary Cardiologist (physician) and Advanced Practice Providers (APPs -  Physician Assistants and Nurse Practitioners) who all work together to provide you with the care you need, when you need it.  We recommend signing up for the patient portal called "MyChart".  Sign up information is provided on this After Visit Summary.  MyChart is used to connect with patients for Virtual Visits (Telemedicine).  Patients are able to view lab/test results, encounter notes, upcoming appointments, etc.  Non-urgent messages can be sent to your provider as well.   To learn more about what you can do with MyChart, go to [email or web address].    Your next appointment:   6 month(s)  The format for your next appointment:   In Person  Provider:   Norman Herrlich, MD   Other Instructions
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
## 2020-07-10 ENCOUNTER — Telehealth: Payer: Self-pay | Admitting: Cardiology

## 2020-07-10 NOTE — Telephone Encounter (Signed)
This is Dr. Hulen Shouts pt, Rosalita Levan

## 2020-07-10 NOTE — Telephone Encounter (Signed)
Patient calling the office for samples of medication: ° ° °1.  What medication and dosage are you requesting samples for? Pradaxa 150mg ° °2.  Are you currently out of this medication? Yes  ° ° ° °

## 2020-07-10 NOTE — Telephone Encounter (Signed)
Pt returning call

## 2020-07-10 NOTE — Telephone Encounter (Signed)
Spoke to patient just now and let her know that her samples have been pulled and set aside for her. She states that she will come and pick these up today.   Encouraged patient to call back with any questions or concerns.

## 2020-07-20 ENCOUNTER — Telehealth: Payer: Self-pay

## 2020-07-20 NOTE — Telephone Encounter (Signed)
Left voicemail on patient's home phone per DPR that her application for patient assistance has been approved from 03-13-20 until 09-25-20.

## 2020-07-23 ENCOUNTER — Other Ambulatory Visit: Payer: Self-pay | Admitting: Cardiology

## 2020-07-23 MED ORDER — DABIGATRAN ETEXILATE MESYLATE 150 MG PO CAPS: 150.0000 mg | ORAL_CAPSULE | Freq: Two times a day (BID) | ORAL | 3 refills | Status: DC

## 2020-07-23 NOTE — Telephone Encounter (Signed)
Refill sent in per request.  

## 2020-07-23 NOTE — Telephone Encounter (Signed)
*  STAT* If patient is at the pharmacy, call can be transferred to refill team.   1. Which medications need to be refilled? (please list name of each medication and dose if known) dabigatran (PRADAXA) 150 MG CAPS capsule  2. Which pharmacy/location (including street and city if local pharmacy) is medication to be sent to? [id number] - [place], [place] - [address]  3. Do they need a 30 day or 90 day supply? 30 day

## 2020-07-28 ENCOUNTER — Ambulatory Visit (INDEPENDENT_AMBULATORY_CARE_PROVIDER_SITE_OTHER): Payer: Medicare HMO

## 2020-07-28 DIAGNOSIS — I441 Atrioventricular block, second degree: Secondary | ICD-10-CM

## 2020-07-28 LAB — CUP PACEART REMOTE DEVICE CHECK
Battery Remaining Longevity: 122 mo
Battery Remaining Percentage: 95.5 %
Battery Voltage: 2.98 V
Brady Statistic AP VP Percent: 65 %
Brady Statistic AP VS Percent: 1 %
Brady Statistic AS VP Percent: 35 %
Brady Statistic AS VS Percent: 1 %
Brady Statistic RA Percent Paced: 64 %
Brady Statistic RV Percent Paced: 99 %
Date Time Interrogation Session: 20211102040012
Implantable Lead Implant Date: 20170215
Implantable Lead Implant Date: 20170215
Implantable Lead Location: 753859
Implantable Lead Location: 753860
Implantable Pulse Generator Implant Date: 20170215
Lead Channel Impedance Value: 410 Ohm
Lead Channel Impedance Value: 550 Ohm
Lead Channel Pacing Threshold Amplitude: 0.5 V
Lead Channel Pacing Threshold Amplitude: 0.5 V
Lead Channel Pacing Threshold Pulse Width: 0.4 ms
Lead Channel Pacing Threshold Pulse Width: 0.4 ms
Lead Channel Sensing Intrinsic Amplitude: 12 mV
Lead Channel Sensing Intrinsic Amplitude: 5 mV
Lead Channel Setting Pacing Amplitude: 0.75 V
Lead Channel Setting Pacing Amplitude: 2 V
Lead Channel Setting Pacing Pulse Width: 0.4 ms
Lead Channel Setting Sensing Sensitivity: 2 mV
Pulse Gen Model: 2240
Pulse Gen Serial Number: 7863758

## 2020-07-30 NOTE — Progress Notes (Signed)
Remote pacemaker transmission.   

## 2020-09-02 ENCOUNTER — Other Ambulatory Visit: Payer: Self-pay | Admitting: Cardiology

## 2020-09-02 NOTE — Telephone Encounter (Signed)
Atorvastatin 10 mg # 90 with 3 refills sent to [facility]

## 2020-10-27 ENCOUNTER — Ambulatory Visit (INDEPENDENT_AMBULATORY_CARE_PROVIDER_SITE_OTHER): Payer: Medicare HMO

## 2020-10-27 DIAGNOSIS — I441 Atrioventricular block, second degree: Secondary | ICD-10-CM | POA: Diagnosis not present

## 2020-10-27 LAB — CUP PACEART REMOTE DEVICE CHECK
Battery Remaining Longevity: 123 mo
Battery Remaining Percentage: 95.5 %
Battery Voltage: 2.98 V
Brady Statistic AP VP Percent: 59 %
Brady Statistic AP VS Percent: 4 %
Brady Statistic AS VP Percent: 34 %
Brady Statistic AS VS Percent: 2.4 %
Brady Statistic RA Percent Paced: 62 %
Brady Statistic RV Percent Paced: 93 %
Date Time Interrogation Session: 20220201040016
Implantable Lead Implant Date: 20170215
Implantable Lead Implant Date: 20170215
Implantable Lead Location: 753859
Implantable Lead Location: 753860
Implantable Pulse Generator Implant Date: 20170215
Lead Channel Impedance Value: 430 Ohm
Lead Channel Impedance Value: 550 Ohm
Lead Channel Pacing Threshold Amplitude: 0.5 V
Lead Channel Pacing Threshold Amplitude: 0.625 V
Lead Channel Pacing Threshold Pulse Width: 0.4 ms
Lead Channel Pacing Threshold Pulse Width: 0.4 ms
Lead Channel Sensing Intrinsic Amplitude: 12 mV
Lead Channel Sensing Intrinsic Amplitude: 5 mV
Lead Channel Setting Pacing Amplitude: 0.875
Lead Channel Setting Pacing Amplitude: 2 V
Lead Channel Setting Pacing Pulse Width: 0.4 ms
Lead Channel Setting Sensing Sensitivity: 2 mV
Pulse Gen Model: 2240
Pulse Gen Serial Number: 7863758

## 2020-11-05 NOTE — Progress Notes (Signed)
Remote pacemaker transmission.   

## 2020-11-10 DIAGNOSIS — I10 Essential (primary) hypertension: Secondary | ICD-10-CM
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
## 2020-12-17 ENCOUNTER — Other Ambulatory Visit: Payer: Self-pay

## 2020-12-17 ENCOUNTER — Ambulatory Visit: Payer: Medicare HMO | Admitting: Cardiology

## 2020-12-17 ENCOUNTER — Encounter: Payer: Self-pay | Admitting: Cardiology

## 2020-12-17 VITALS — BP 108/70 | HR 62 | Ht 64.0 in | Wt 164.0 lb

## 2020-12-17 DIAGNOSIS — Z7901 Long term (current) use of anticoagulants: Secondary | ICD-10-CM | POA: Diagnosis not present

## 2020-12-17 DIAGNOSIS — E782 Mixed hyperlipidemia: Secondary | ICD-10-CM

## 2020-12-17 DIAGNOSIS — I5032 Chronic diastolic (congestive) heart failure: Secondary | ICD-10-CM

## 2020-12-17 DIAGNOSIS — I110 Hypertensive heart disease with heart failure: Secondary | ICD-10-CM

## 2020-12-17 DIAGNOSIS — I340 Nonrheumatic mitral (valve) insufficiency: Secondary | ICD-10-CM

## 2020-12-17 DIAGNOSIS — Z950 Presence of cardiac pacemaker: Secondary | ICD-10-CM | POA: Diagnosis not present

## 2020-12-17 DIAGNOSIS — I480 Paroxysmal atrial fibrillation: Secondary | ICD-10-CM

## 2020-12-17 DIAGNOSIS — I441 Atrioventricular block, second degree: Secondary | ICD-10-CM

## 2020-12-17 NOTE — Patient Instructions (Signed)

## 2020-12-18 ENCOUNTER — Telehealth: Payer: Self-pay

## 2020-12-18 LAB — COMPREHENSIVE METABOLIC PANEL
ALT: 18 IU/L (ref 0–32)
AST: 36 IU/L (ref 0–40)
Albumin/Globulin Ratio: 1.3 (ref 1.2–2.2)
Albumin: 3.9 g/dL (ref 3.7–4.7)
Alkaline Phosphatase: 105 IU/L (ref 44–121)
BUN/Creatinine Ratio: 20 (ref 12–28)
BUN: 15 mg/dL (ref 8–27)
Bilirubin Total: 0.7 mg/dL (ref 0.0–1.2)
CO2: 22 mmol/L (ref 20–29)
Calcium: 10.2 mg/dL (ref 8.7–10.3)
Chloride: 102 mmol/L (ref 96–106)
Creatinine, Ser: 0.75 mg/dL (ref 0.57–1.00)
Globulin, Total: 3.1 g/dL (ref 1.5–4.5)
Glucose: 90 mg/dL (ref 65–99)
Potassium: 4.1 mmol/L (ref 3.5–5.2)
Sodium: 144 mmol/L (ref 134–144)
Total Protein: 7 g/dL (ref 6.0–8.5)
eGFR: 80 mL/min/{1.73_m2} (ref >59)

## 2020-12-18 LAB — LIPID PANEL
Chol/HDL Ratio: 2.3 ratio (ref 0.0–4.4)
Cholesterol, Total: 157 mg/dL (ref 100–199)
HDL: 68 mg/dL (ref >39)
LDL Chol Calc (NIH): 75 mg/dL (ref 0–99)
Triglycerides: 70 mg/dL (ref 0–149)
VLDL Cholesterol Cal: 14 mg/dL (ref 5–40)

## 2020-12-18 NOTE — Telephone Encounter (Signed)
-----   Message from Brian J Munley, MD sent at 12/18/2020 10:47 AM EDT ----- Stable results lipids at target no change in treatment 

## 2020-12-18 NOTE — Telephone Encounter (Signed)
Patient returning call.

## 2020-12-18 NOTE — Telephone Encounter (Signed)
Left message on patients voicemail to please return our call.   

## 2020-12-18 NOTE — Telephone Encounter (Signed)
Spoke with patient regarding results and recommendation.  Patient verbalizes understanding and is agreeable to plan of care. Advised patient to call back with any issues or concerns.  

## 2020-12-18 NOTE — Telephone Encounter (Signed)
-----   Message from Baldo Daub, MD sent at 12/18/2020 10:47 AM EDT ----- Stable results lipids at target no change in treatment

## 2021-01-26 ENCOUNTER — Ambulatory Visit (INDEPENDENT_AMBULATORY_CARE_PROVIDER_SITE_OTHER): Payer: Medicare HMO

## 2021-01-26 DIAGNOSIS — I4430 Unspecified atrioventricular block: Secondary | ICD-10-CM | POA: Diagnosis not present

## 2021-01-26 LAB — CUP PACEART REMOTE DEVICE CHECK
Battery Remaining Longevity: 117 mo
Battery Remaining Percentage: 95.5 %
Battery Voltage: 2.96 V
Brady Statistic AP VP Percent: 61 %
Brady Statistic AP VS Percent: 2.9 %
Brady Statistic AS VP Percent: 34 %
Brady Statistic AS VS Percent: 1.8 %
Brady Statistic RA Percent Paced: 62 %
Brady Statistic RV Percent Paced: 95 %
Date Time Interrogation Session: 20220503040013
Implantable Lead Implant Date: 20170215
Implantable Lead Implant Date: 20170215
Implantable Lead Location: 753859
Implantable Lead Location: 753860
Implantable Pulse Generator Implant Date: 20170215
Lead Channel Impedance Value: 460 Ohm
Lead Channel Impedance Value: 550 Ohm
Lead Channel Pacing Threshold Amplitude: 0.5 V
Lead Channel Pacing Threshold Amplitude: 0.5 V
Lead Channel Pacing Threshold Pulse Width: 0.4 ms
Lead Channel Pacing Threshold Pulse Width: 0.4 ms
Lead Channel Sensing Intrinsic Amplitude: 12 mV
Lead Channel Sensing Intrinsic Amplitude: 5 mV
Lead Channel Setting Pacing Amplitude: 0.75 V
Lead Channel Setting Pacing Amplitude: 2 V
Lead Channel Setting Pacing Pulse Width: 0.4 ms
Lead Channel Setting Sensing Sensitivity: 2 mV
Pulse Gen Model: 2240
Pulse Gen Serial Number: 7863758

## 2021-02-08 ENCOUNTER — Other Ambulatory Visit: Payer: Self-pay | Admitting: Cardiology

## 2021-02-08 MED ORDER — DABIGATRAN ETEXILATE MESYLATE 150 MG PO CAPS: 150.0000 mg | ORAL_CAPSULE | Freq: Two times a day (BID) | ORAL | 1 refills | Status: DC

## 2021-02-08 NOTE — Telephone Encounter (Signed)
 *  STAT* If patient is at the pharmacy, call can be transferred to refill team.   1. Which medications need to be refilled? (please list name of each medication and dose if known) Pradaxa   2. Which pharmacy/location (including street and city if local pharmacy) is medication to be sent to? [phone] [facility]   3. Do they need a 30 day or 90 day supply? 90    Patient states the pharmacy faxed a application for the doctor and her to fill out  Patient only has a week supply

## 2021-02-08 NOTE — Telephone Encounter (Signed)
30f, 74.4kg, scr 0.75 12/17/20, lovw/munley 12/17/20, ccr 82.7

## 2021-02-15 ENCOUNTER — Other Ambulatory Visit: Payer: Self-pay | Admitting: Cardiology

## 2021-02-15 NOTE — Telephone Encounter (Signed)
Rx refill sent to pharmacy. 

## 2021-02-17 NOTE — Progress Notes (Signed)
Remote pacemaker transmission.   

## 2021-03-01 ENCOUNTER — Telehealth: Payer: Self-pay | Admitting: Cardiology

## 2021-03-01 NOTE — Telephone Encounter (Signed)
Follow Up:      Pt wants to know if you have heard anything from [facility], concerning her assistant with Pradaxa?

## 2021-03-01 NOTE — Telephone Encounter (Signed)
Spoke to the patient just now and let her know that I called her several weeks back and gave her the phone number for social security that she needed to call to determine her eligibility. This is what was faxed back to me when submitting the request to [facility] for her. She states that she did not call and speak to anyone at the number provided and I gave her the number again this morning. She states that she will call.    Encouraged patient to call back with any questions or concerns.

## 2021-03-19 ENCOUNTER — Other Ambulatory Visit: Payer: Self-pay

## 2021-03-19 ENCOUNTER — Encounter: Payer: Self-pay | Admitting: Cardiology

## 2021-03-19 ENCOUNTER — Ambulatory Visit: Payer: Medicare HMO | Admitting: Cardiology

## 2021-03-19 VITALS — BP 136/42 | HR 66 | Ht 64.0 in | Wt 165.0 lb

## 2021-03-19 DIAGNOSIS — I441 Atrioventricular block, second degree: Secondary | ICD-10-CM | POA: Diagnosis not present
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
## 2021-03-24 ENCOUNTER — Telehealth: Payer: Self-pay | Admitting: Cardiology

## 2021-03-24 NOTE — Telephone Encounter (Signed)
Left message for Bonita Quin to call me back. I am unsure if she would like to come by and pick up this note or if she would like for me to mail it.

## 2021-03-24 NOTE — Telephone Encounter (Signed)
These have been printed and are up front for the patient to pick up. They are aware of this.

## 2021-03-24 NOTE — Telephone Encounter (Signed)
Pts friend Corena Pilgrim is requesting for the after visit summary for the pts last few appts as well as a list of her current medications in order to keep up a folder for the pt and make sure she is following Dr's specific instructions...Marland Kitchen please advise.

## 2021-03-24 NOTE — Telephone Encounter (Signed)
Vernona Rieger patient's friend states the patient is requesting a print out of what was discussed during her appointment and her medication list. She states the patient is getting a little forgetful.

## 2021-04-27 ENCOUNTER — Ambulatory Visit (INDEPENDENT_AMBULATORY_CARE_PROVIDER_SITE_OTHER): Payer: Medicare HMO

## 2021-04-27 DIAGNOSIS — I441 Atrioventricular block, second degree: Secondary | ICD-10-CM | POA: Diagnosis not present

## 2021-04-27 LAB — CUP PACEART REMOTE DEVICE CHECK
Battery Remaining Longevity: 55 mo
Battery Remaining Percentage: 45 %
Battery Voltage: 2.96 V
Brady Statistic AP VP Percent: 70 %
Brady Statistic AP VS Percent: 1 %
Brady Statistic AS VP Percent: 29 %
Brady Statistic AS VS Percent: 1 %
Brady Statistic RA Percent Paced: 70 %
Brady Statistic RV Percent Paced: 99 %
Date Time Interrogation Session: 20220802040015
Implantable Lead Implant Date: 20170215
Implantable Lead Implant Date: 20170215
Implantable Lead Location: 753859
Implantable Lead Location: 753860
Implantable Pulse Generator Implant Date: 20170215
Lead Channel Impedance Value: 460 Ohm
Lead Channel Impedance Value: 540 Ohm
Lead Channel Pacing Threshold Amplitude: 0.5 V
Lead Channel Pacing Threshold Amplitude: 0.625 V
Lead Channel Pacing Threshold Pulse Width: 0.4 ms
Lead Channel Pacing Threshold Pulse Width: 0.4 ms
Lead Channel Sensing Intrinsic Amplitude: 12 mV
Lead Channel Sensing Intrinsic Amplitude: 4.7 mV
Lead Channel Setting Pacing Amplitude: 0.875
Lead Channel Setting Pacing Amplitude: 2 V
Lead Channel Setting Pacing Pulse Width: 0.4 ms
Lead Channel Setting Sensing Sensitivity: 4 mV
Pulse Gen Model: 2240
Pulse Gen Serial Number: 7863758

## 2021-05-18 ENCOUNTER — Other Ambulatory Visit: Payer: Self-pay | Admitting: Cardiology

## 2021-05-22 NOTE — Progress Notes (Signed)
Remote pacemaker transmission.   

## 2021-05-27 ENCOUNTER — Telehealth: Payer: Self-pay | Admitting: Cardiology

## 2021-05-27 NOTE — Telephone Encounter (Signed)
Spoke to the patient just now and let her know that she should be taking this medication. She verbalizes understanding and thanks me for the call back.

## 2021-05-27 NOTE — Telephone Encounter (Signed)
Pt c/o medication issue:  1. Name of Medication:  atorvastatin (LIPITOR) 10 MG tablet  2. How are you currently taking this medication (dosage and times per day)?    3. Are you having a reaction (difficulty breathing--STAT)?    4. What is your medication issue?   Patient would like to know if she needs to be taking this medication. Please advise.
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
## 2021-06-03 ENCOUNTER — Encounter: Payer: Self-pay | Admitting: Cardiology

## 2021-06-03 ENCOUNTER — Ambulatory Visit: Payer: Medicare HMO | Admitting: Cardiology

## 2021-06-03 ENCOUNTER — Other Ambulatory Visit: Payer: Self-pay

## 2021-06-03 VITALS — BP 110/72 | HR 64 | Ht 64.0 in | Wt 163.0 lb

## 2021-06-03 DIAGNOSIS — I5032 Chronic diastolic (congestive) heart failure: Secondary | ICD-10-CM

## 2021-06-03 DIAGNOSIS — Z950 Presence of cardiac pacemaker: Secondary | ICD-10-CM | POA: Diagnosis not present

## 2021-06-03 DIAGNOSIS — I480 Paroxysmal atrial fibrillation: Secondary | ICD-10-CM

## 2021-06-03 DIAGNOSIS — E782 Mixed hyperlipidemia: Secondary | ICD-10-CM

## 2021-06-03 DIAGNOSIS — I441 Atrioventricular block, second degree: Secondary | ICD-10-CM

## 2021-06-03 DIAGNOSIS — Z7901 Long term (current) use of anticoagulants: Secondary | ICD-10-CM | POA: Diagnosis not present

## 2021-06-03 DIAGNOSIS — I110 Hypertensive heart disease with heart failure: Secondary | ICD-10-CM

## 2021-06-03 NOTE — Patient Instructions (Addendum)

## 2021-06-03 NOTE — Addendum Note (Signed)
Addended by: Delorse Limber I on: 06/03/2021 01:11 PM   Modules accepted: Orders

## 2021-06-04 ENCOUNTER — Telehealth: Payer: Self-pay

## 2021-06-04 LAB — COMPREHENSIVE METABOLIC PANEL
ALT: 21 IU/L (ref 0–32)
AST: 35 IU/L (ref 0–40)
Albumin/Globulin Ratio: 1.6 (ref 1.2–2.2)
Albumin: 4.2 g/dL (ref 3.6–4.6)
Alkaline Phosphatase: 70 IU/L (ref 44–121)
BUN/Creatinine Ratio: 23 (ref 12–28)
BUN: 16 mg/dL (ref 8–27)
Bilirubin Total: 0.7 mg/dL (ref 0.0–1.2)
CO2: 30 mmol/L — ABNORMAL HIGH (ref 20–29)
Calcium: 10.5 mg/dL — ABNORMAL HIGH (ref 8.7–10.3)
Chloride: 101 mmol/L (ref 96–106)
Creatinine, Ser: 0.71 mg/dL (ref 0.57–1.00)
Globulin, Total: 2.6 g/dL (ref 1.5–4.5)
Glucose: 99 mg/dL (ref 65–99)
Potassium: 4.1 mmol/L (ref 3.5–5.2)
Sodium: 144 mmol/L (ref 134–144)
Total Protein: 6.8 g/dL (ref 6.0–8.5)
eGFR: 85 mL/min/{1.73_m2} (ref >59)

## 2021-06-04 LAB — CBC
Hematocrit: 40.9 % (ref 34.0–46.6)
Hemoglobin: 13.7 g/dL (ref 11.1–15.9)
MCH: 31.4 pg (ref 26.6–33.0)
MCHC: 33.5 g/dL (ref 31.5–35.7)
MCV: 94 fL (ref 79–97)
Platelets: 225 10*3/uL (ref 150–450)
RBC: 4.37 x10E6/uL (ref 3.77–5.28)
RDW: 13.7 % (ref 11.7–15.4)
WBC: 3 10*3/uL — ABNORMAL LOW (ref 3.4–10.8)

## 2021-06-04 LAB — LIPID PANEL
Chol/HDL Ratio: 2.3 ratio (ref 0.0–4.4)
Cholesterol, Total: 154 mg/dL (ref 100–199)
HDL: 67 mg/dL (ref >39)
LDL Chol Calc (NIH): 74 mg/dL (ref 0–99)
Triglycerides: 66 mg/dL (ref 0–149)
VLDL Cholesterol Cal: 13 mg/dL (ref 5–40)

## 2021-06-04 NOTE — Telephone Encounter (Signed)
Patient notified of results.

## 2021-06-04 NOTE — Telephone Encounter (Signed)
-----   Message from Baldo Daub, MD sent at 06/04/2021 12:26 PM EDT ----- Normal or stable result  No changes in treatment

## 2021-07-27 ENCOUNTER — Ambulatory Visit (INDEPENDENT_AMBULATORY_CARE_PROVIDER_SITE_OTHER): Payer: Medicare HMO

## 2021-07-27 DIAGNOSIS — I441 Atrioventricular block, second degree: Secondary | ICD-10-CM | POA: Diagnosis not present

## 2021-07-27 LAB — CUP PACEART REMOTE DEVICE CHECK
Battery Remaining Longevity: 52 mo
Battery Remaining Percentage: 43 %
Battery Voltage: 2.96 V
Brady Statistic AP VP Percent: 61 %
Brady Statistic AP VS Percent: 1 %
Brady Statistic AS VP Percent: 38 %
Brady Statistic AS VS Percent: 1 %
Brady Statistic RA Percent Paced: 60 %
Brady Statistic RV Percent Paced: 99 %
Date Time Interrogation Session: 20221101040015
Implantable Lead Implant Date: 20170215
Implantable Lead Implant Date: 20170215
Implantable Lead Location: 753859
Implantable Lead Location: 753860
Implantable Pulse Generator Implant Date: 20170215
Lead Channel Impedance Value: 460 Ohm
Lead Channel Impedance Value: 590 Ohm
Lead Channel Pacing Threshold Amplitude: 0.5 V
Lead Channel Pacing Threshold Amplitude: 0.5 V
Lead Channel Pacing Threshold Pulse Width: 0.4 ms
Lead Channel Pacing Threshold Pulse Width: 0.4 ms
Lead Channel Sensing Intrinsic Amplitude: 12 mV
Lead Channel Sensing Intrinsic Amplitude: 5 mV
Lead Channel Setting Pacing Amplitude: 0.75 V
Lead Channel Setting Pacing Amplitude: 2 V
Lead Channel Setting Pacing Pulse Width: 0.4 ms
Lead Channel Setting Sensing Sensitivity: 4 mV
Pulse Gen Model: 2240
Pulse Gen Serial Number: 7863758

## 2021-08-03 NOTE — Progress Notes (Signed)
Remote pacemaker transmission.   

## 2021-08-20 ENCOUNTER — Other Ambulatory Visit: Payer: Self-pay | Admitting: Cardiology

## 2021-10-07 ENCOUNTER — Other Ambulatory Visit: Payer: Self-pay | Admitting: Cardiology

## 2021-10-15 ENCOUNTER — Telehealth: Payer: Self-pay | Admitting: Cardiology

## 2021-10-15 NOTE — Telephone Encounter (Signed)
Spoke to the patient and let her know that I could call this in to another pharmacy for her if she had one that she would like this sent to. She said she is going to call around and see if anyone has this in stock and will call us back if she needs [place] to call it in somewhere else.    Encouraged patient to call back with any questions or concerns.

## 2021-10-15 NOTE — Telephone Encounter (Signed)
Spoke to the patient just now and she wanted to know if there was a generic for pradaxa. I let her know that the prescription she has had called in is the generic dabigatran. She verbalizes understanding.

## 2021-10-15 NOTE — Telephone Encounter (Signed)
Pt is wanting to speak back w/ nurse Lequita Halt in regards to her medication.. please advise

## 2021-10-15 NOTE — Telephone Encounter (Signed)
Patient called stating that [facility] is out of dabigatran (PRADAXA) 150 MG CAPS capsule, they told her they might get a shipment in by noon.  She wants to know what she can do.  She is out of the medication as well. She said she has been trying for a week to get it from [facility].

## 2021-10-26 ENCOUNTER — Ambulatory Visit (INDEPENDENT_AMBULATORY_CARE_PROVIDER_SITE_OTHER): Payer: Medicare HMO

## 2021-10-26 DIAGNOSIS — I441 Atrioventricular block, second degree: Secondary | ICD-10-CM

## 2021-10-26 LAB — CUP PACEART REMOTE DEVICE CHECK
Battery Remaining Longevity: 48 mo
Battery Remaining Percentage: 40 %
Battery Voltage: 2.95 V
Brady Statistic AP VP Percent: 57 %
Brady Statistic AP VS Percent: 1 %
Brady Statistic AS VP Percent: 42 %
Brady Statistic AS VS Percent: 1 %
Brady Statistic RA Percent Paced: 55 %
Brady Statistic RV Percent Paced: 98 %
Date Time Interrogation Session: 20230131040014
Implantable Lead Implant Date: 20170215
Implantable Lead Implant Date: 20170215
Implantable Lead Location: 753859
Implantable Lead Location: 753860
Implantable Pulse Generator Implant Date: 20170215
Lead Channel Impedance Value: 460 Ohm
Lead Channel Impedance Value: 540 Ohm
Lead Channel Pacing Threshold Amplitude: 0.5 V
Lead Channel Pacing Threshold Amplitude: 0.625 V
Lead Channel Pacing Threshold Pulse Width: 0.4 ms
Lead Channel Pacing Threshold Pulse Width: 0.4 ms
Lead Channel Sensing Intrinsic Amplitude: 12 mV
Lead Channel Sensing Intrinsic Amplitude: 4.5 mV
Lead Channel Setting Pacing Amplitude: 0.875
Lead Channel Setting Pacing Amplitude: 2 V
Lead Channel Setting Pacing Pulse Width: 0.4 ms
Lead Channel Setting Sensing Sensitivity: 4 mV
Pulse Gen Model: 2240
Pulse Gen Serial Number: 7863758

## 2021-11-03 NOTE — Progress Notes (Signed)
Remote pacemaker transmission.   

## 2021-11-07 ENCOUNTER — Other Ambulatory Visit: Payer: Self-pay | Admitting: Cardiology

## 2021-12-02 ENCOUNTER — Ambulatory Visit: Payer: Medicare HMO | Admitting: Cardiology

## 2022-01-20 ENCOUNTER — Encounter: Payer: Self-pay | Admitting: Cardiology

## 2022-01-20 ENCOUNTER — Ambulatory Visit: Payer: Medicare HMO | Admitting: Cardiology

## 2022-01-20 VITALS — BP 124/58 | HR 62 | Ht 64.0 in | Wt 154.8 lb

## 2022-01-20 DIAGNOSIS — I480 Paroxysmal atrial fibrillation: Secondary | ICD-10-CM

## 2022-01-20 DIAGNOSIS — Z950 Presence of cardiac pacemaker: Secondary | ICD-10-CM

## 2022-01-20 DIAGNOSIS — I441 Atrioventricular block, second degree: Secondary | ICD-10-CM

## 2022-01-20 DIAGNOSIS — E782 Mixed hyperlipidemia: Secondary | ICD-10-CM

## 2022-01-20 DIAGNOSIS — I340 Nonrheumatic mitral (valve) insufficiency: Secondary | ICD-10-CM

## 2022-01-20 DIAGNOSIS — I110 Hypertensive heart disease with heart failure: Secondary | ICD-10-CM

## 2022-01-20 DIAGNOSIS — Z7901 Long term (current) use of anticoagulants: Secondary | ICD-10-CM | POA: Diagnosis not present

## 2022-01-20 DIAGNOSIS — I5032 Chronic diastolic (congestive) heart failure: Secondary | ICD-10-CM
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
## 2022-01-20 NOTE — Patient Instructions (Signed)

## 2022-01-24 ENCOUNTER — Other Ambulatory Visit: Payer: Self-pay | Admitting: Cardiology

## 2022-01-25 ENCOUNTER — Ambulatory Visit (INDEPENDENT_AMBULATORY_CARE_PROVIDER_SITE_OTHER): Payer: Medicare HMO

## 2022-01-25 DIAGNOSIS — I441 Atrioventricular block, second degree: Secondary | ICD-10-CM

## 2022-01-25 LAB — CUP PACEART REMOTE DEVICE CHECK
Battery Remaining Longevity: 45 mo
Battery Remaining Percentage: 37 %
Battery Voltage: 2.93 V
Brady Statistic AP VP Percent: 60 %
Brady Statistic AP VS Percent: 1 %
Brady Statistic AS VP Percent: 38 %
Brady Statistic AS VS Percent: 1 %
Brady Statistic RA Percent Paced: 56 %
Brady Statistic RV Percent Paced: 99 %
Date Time Interrogation Session: 20230502040015
Implantable Lead Implant Date: 20170215
Implantable Lead Implant Date: 20170215
Implantable Lead Location: 753859
Implantable Lead Location: 753860
Implantable Pulse Generator Implant Date: 20170215
Lead Channel Impedance Value: 410 Ohm
Lead Channel Impedance Value: 540 Ohm
Lead Channel Pacing Threshold Amplitude: 0.5 V
Lead Channel Pacing Threshold Amplitude: 0.5 V
Lead Channel Pacing Threshold Pulse Width: 0.4 ms
Lead Channel Pacing Threshold Pulse Width: 0.4 ms
Lead Channel Sensing Intrinsic Amplitude: 12 mV
Lead Channel Sensing Intrinsic Amplitude: 5 mV
Lead Channel Setting Pacing Amplitude: 0.75 V
Lead Channel Setting Pacing Amplitude: 2 V
Lead Channel Setting Pacing Pulse Width: 0.4 ms
Lead Channel Setting Sensing Sensitivity: 4 mV
Pulse Gen Model: 2240
Pulse Gen Serial Number: 7863758

## 2022-02-09 NOTE — Progress Notes (Signed)
Remote pacemaker transmission.   

## 2022-03-14 ENCOUNTER — Other Ambulatory Visit: Payer: Self-pay | Admitting: Cardiology

## 2022-04-25 ENCOUNTER — Encounter: Payer: Self-pay | Admitting: Cardiology

## 2022-04-25 NOTE — Telephone Encounter (Signed)
Error

## 2022-04-26 ENCOUNTER — Ambulatory Visit (INDEPENDENT_AMBULATORY_CARE_PROVIDER_SITE_OTHER): Payer: Medicare HMO

## 2022-04-26 DIAGNOSIS — I441 Atrioventricular block, second degree: Secondary | ICD-10-CM

## 2022-04-26 LAB — CUP PACEART REMOTE DEVICE CHECK
Battery Remaining Longevity: 41 mo
Battery Remaining Percentage: 35 %
Battery Voltage: 2.93 V
Brady Statistic AP VP Percent: 63 %
Brady Statistic AP VS Percent: 1 %
Brady Statistic AS VP Percent: 36 %
Brady Statistic AS VS Percent: 1 %
Brady Statistic RA Percent Paced: 59 %
Brady Statistic RV Percent Paced: 99 %
Date Time Interrogation Session: 20230801040012
Implantable Lead Implant Date: 20170215
Implantable Lead Implant Date: 20170215
Implantable Lead Location: 753859
Implantable Lead Location: 753860
Implantable Pulse Generator Implant Date: 20170215
Lead Channel Impedance Value: 430 Ohm
Lead Channel Impedance Value: 560 Ohm
Lead Channel Pacing Threshold Amplitude: 0.5 V
Lead Channel Pacing Threshold Amplitude: 0.5 V
Lead Channel Pacing Threshold Pulse Width: 0.4 ms
Lead Channel Pacing Threshold Pulse Width: 0.4 ms
Lead Channel Sensing Intrinsic Amplitude: 12 mV
Lead Channel Sensing Intrinsic Amplitude: 4.9 mV
Lead Channel Setting Pacing Amplitude: 0.75 V
Lead Channel Setting Pacing Amplitude: 2 V
Lead Channel Setting Pacing Pulse Width: 0.4 ms
Lead Channel Setting Sensing Sensitivity: 4 mV
Pulse Gen Model: 2240
Pulse Gen Serial Number: 7863758

## 2022-05-25 NOTE — Progress Notes (Signed)
Remote pacemaker transmission.   

## 2022-07-26 ENCOUNTER — Ambulatory Visit (INDEPENDENT_AMBULATORY_CARE_PROVIDER_SITE_OTHER): Payer: Medicare HMO

## 2022-07-26 DIAGNOSIS — Z950 Presence of cardiac pacemaker: Secondary | ICD-10-CM | POA: Diagnosis not present

## 2022-07-26 DIAGNOSIS — I441 Atrioventricular block, second degree: Secondary | ICD-10-CM | POA: Diagnosis not present

## 2022-07-26 LAB — CUP PACEART REMOTE DEVICE CHECK
Battery Remaining Longevity: 38 mo
Battery Remaining Percentage: 32 %
Battery Voltage: 2.9 V
Brady Statistic AP VP Percent: 65 %
Brady Statistic AP VS Percent: 1 %
Brady Statistic AS VP Percent: 33 %
Brady Statistic AS VS Percent: 1 %
Brady Statistic RA Percent Paced: 62 %
Brady Statistic RV Percent Paced: 99 %
Date Time Interrogation Session: 20231031040014
Implantable Lead Connection Status: 753985
Implantable Lead Connection Status: 753985
Implantable Lead Implant Date: 20170215
Implantable Lead Implant Date: 20170215
Implantable Lead Location: 753859
Implantable Lead Location: 753860
Implantable Pulse Generator Implant Date: 20170215
Lead Channel Impedance Value: 400 Ohm
Lead Channel Impedance Value: 540 Ohm
Lead Channel Pacing Threshold Amplitude: 0.5 V
Lead Channel Pacing Threshold Amplitude: 0.5 V
Lead Channel Pacing Threshold Pulse Width: 0.4 ms
Lead Channel Pacing Threshold Pulse Width: 0.4 ms
Lead Channel Sensing Intrinsic Amplitude: 12 mV
Lead Channel Sensing Intrinsic Amplitude: 5 mV
Lead Channel Setting Pacing Amplitude: 0.75 V
Lead Channel Setting Pacing Amplitude: 2 V
Lead Channel Setting Pacing Pulse Width: 0.4 ms
Lead Channel Setting Sensing Sensitivity: 4 mV
Pulse Gen Model: 2240
Pulse Gen Serial Number: 7863758

## 2022-08-10 NOTE — Progress Notes (Signed)
Remote pacemaker transmission.   

## 2022-08-23 ENCOUNTER — Other Ambulatory Visit: Payer: Self-pay | Admitting: Cardiology

## 2022-08-23 NOTE — Telephone Encounter (Signed)
Rx refill sent to pharmacy. 

## 2022-09-02 ENCOUNTER — Telehealth: Payer: Self-pay

## 2022-09-02 ENCOUNTER — Other Ambulatory Visit: Payer: Self-pay

## 2022-09-02 DIAGNOSIS — Z7901 Long term (current) use of anticoagulants: Secondary | ICD-10-CM

## 2022-09-02 MED ORDER — APIXABAN 5 MG PO TABS: 5.0000 mg | ORAL_TABLET | Freq: Two times a day (BID) | ORAL | 3 refills | Status: DC

## 2022-09-02 NOTE — Telephone Encounter (Signed)
Patient came by the office and stated that she had been to multiple pharmacies and that none of them had Pradaxa in stock. I spoke to Dr. Bing Matter, the DOD, and he recommended starting the patient on Eliquis 5 mg twice daily and to have the lab draw a BMP. The lab draw (BMP) was completed and the new Eliquis prescription was sent to the patients pharmacy. Patient was agreeable with this plan and had no further questions at this time.

## 2022-09-03 LAB — BASIC METABOLIC PANEL
BUN/Creatinine Ratio: 24 (ref 12–28)
BUN: 16 mg/dL (ref 8–27)
CO2: 30 mmol/L — ABNORMAL HIGH (ref 20–29)
Calcium: 10.1 mg/dL (ref 8.7–10.3)
Chloride: 100 mmol/L (ref 96–106)
Creatinine, Ser: 0.68 mg/dL (ref 0.57–1.00)
Glucose: 120 mg/dL — ABNORMAL HIGH (ref 70–99)
Potassium: 3.6 mmol/L (ref 3.5–5.2)
Sodium: 141 mmol/L (ref 134–144)
eGFR: 87 mL/min/{1.73_m2} (ref >59)

## 2022-09-08 ENCOUNTER — Other Ambulatory Visit: Payer: Self-pay | Admitting: Cardiology

## 2022-09-08 NOTE — Telephone Encounter (Signed)
Refills sent to pharmacy. 

## 2022-09-22 ENCOUNTER — Telehealth: Payer: Self-pay | Admitting: Cardiology

## 2022-09-22 ENCOUNTER — Other Ambulatory Visit: Payer: Self-pay

## 2022-09-22 NOTE — Telephone Encounter (Signed)
Called patient and attempted to clarify that she should be on Eliquis instead of Pradaxa over the phone. Patient became confused regarding which medication she should be taking. I asked her to bring all of her medication to the office so I could review here medications with her. Patient arrived at the office and her medication list was updated and I clarified with her that she was taking Eliquis instead of Pradaxa. Patient stated that she understood and had no further questions.

## 2022-09-22 NOTE — Telephone Encounter (Signed)
Pt c/o medication issue:  1. Name of Medication: apixaban (ELIQUIS) 5 MG TABS tablet   2. How are you currently taking this medication (dosage and times per day)? As prescribed   3. Are you having a reaction (difficulty breathing--STAT)?   4. What is your medication issue? Pt would like a call back to get clarification on this medication and if she is to remain on it. Please advise.

## 2022-10-02 ENCOUNTER — Other Ambulatory Visit: Payer: Self-pay | Admitting: Cardiology

## 2022-10-25 ENCOUNTER — Ambulatory Visit: Payer: Medicare HMO

## 2022-10-25 DIAGNOSIS — I441 Atrioventricular block, second degree: Secondary | ICD-10-CM | POA: Diagnosis not present

## 2022-10-25 LAB — CUP PACEART REMOTE DEVICE CHECK
Battery Remaining Longevity: 34 mo
Battery Remaining Percentage: 29 %
Battery Voltage: 2.89 V
Brady Statistic AP VP Percent: 65 %
Brady Statistic AP VS Percent: 1 %
Brady Statistic AS VP Percent: 34 %
Brady Statistic AS VS Percent: 1 %
Brady Statistic RA Percent Paced: 62 %
Brady Statistic RV Percent Paced: 99 %
Date Time Interrogation Session: 20240130040014
Implantable Lead Connection Status: 753985
Implantable Lead Connection Status: 753985
Implantable Lead Implant Date: 20170215
Implantable Lead Implant Date: 20170215
Implantable Lead Location: 753859
Implantable Lead Location: 753860
Implantable Pulse Generator Implant Date: 20170215
Lead Channel Impedance Value: 440 Ohm
Lead Channel Impedance Value: 550 Ohm
Lead Channel Pacing Threshold Amplitude: 0.5 V
Lead Channel Pacing Threshold Amplitude: 0.625 V
Lead Channel Pacing Threshold Pulse Width: 0.4 ms
Lead Channel Pacing Threshold Pulse Width: 0.4 ms
Lead Channel Sensing Intrinsic Amplitude: 12 mV
Lead Channel Sensing Intrinsic Amplitude: 4.7 mV
Lead Channel Setting Pacing Amplitude: 0.875
Lead Channel Setting Pacing Amplitude: 2 V
Lead Channel Setting Pacing Pulse Width: 0.4 ms
Lead Channel Setting Sensing Sensitivity: 4 mV
Pulse Gen Model: 2240
Pulse Gen Serial Number: 7863758

## 2022-11-22 NOTE — Progress Notes (Signed)
Remote pacemaker transmission.   

## 2023-01-07 ENCOUNTER — Other Ambulatory Visit: Payer: Self-pay | Admitting: Cardiology

## 2023-01-24 ENCOUNTER — Ambulatory Visit (INDEPENDENT_AMBULATORY_CARE_PROVIDER_SITE_OTHER): Payer: Medicare HMO

## 2023-01-24 DIAGNOSIS — I441 Atrioventricular block, second degree: Secondary | ICD-10-CM

## 2023-01-25 LAB — CUP PACEART REMOTE DEVICE CHECK
Battery Remaining Longevity: 30 mo
Battery Remaining Percentage: 27 %
Battery Voltage: 2.89 V
Brady Statistic AP VP Percent: 64 %
Brady Statistic AP VS Percent: 1 %
Brady Statistic AS VP Percent: 35 %
Brady Statistic AS VS Percent: 1 %
Brady Statistic RA Percent Paced: 61 %
Brady Statistic RV Percent Paced: 99 %
Date Time Interrogation Session: 20240430040014
Implantable Lead Connection Status: 753985
Implantable Lead Connection Status: 753985
Implantable Lead Implant Date: 20170215
Implantable Lead Implant Date: 20170215
Implantable Lead Location: 753859
Implantable Lead Location: 753860
Implantable Pulse Generator Implant Date: 20170215
Lead Channel Impedance Value: 400 Ohm
Lead Channel Impedance Value: 510 Ohm
Lead Channel Pacing Threshold Amplitude: 0.5 V
Lead Channel Pacing Threshold Amplitude: 0.5 V
Lead Channel Pacing Threshold Pulse Width: 0.4 ms
Lead Channel Pacing Threshold Pulse Width: 0.4 ms
Lead Channel Sensing Intrinsic Amplitude: 12 mV
Lead Channel Sensing Intrinsic Amplitude: 4 mV
Lead Channel Setting Pacing Amplitude: 0.75 V
Lead Channel Setting Pacing Amplitude: 2 V
Lead Channel Setting Pacing Pulse Width: 0.4 ms
Lead Channel Setting Sensing Sensitivity: 4 mV
Pulse Gen Model: 2240
Pulse Gen Serial Number: 7863758

## 2023-02-15 NOTE — Progress Notes (Signed)
Remote pacemaker transmission.   

## 2023-02-16 ENCOUNTER — Other Ambulatory Visit: Payer: Self-pay | Admitting: Cardiology

## 2023-02-16 NOTE — Telephone Encounter (Signed)
Rx sent to pharmacy   

## 2023-02-23 ENCOUNTER — Other Ambulatory Visit: Payer: Self-pay | Admitting: Cardiology

## 2023-03-13 ENCOUNTER — Other Ambulatory Visit: Payer: Self-pay

## 2023-03-13 MED ORDER — AMLODIPINE BESYLATE 5 MG PO TABS: 5.0000 mg | ORAL_TABLET | Freq: Every day | ORAL | 0 refills | Status: DC

## 2023-04-13 NOTE — Progress Notes (Signed)
Cardiology Office Note:    Date:  04/14/2023   ID:  Felicia Arnold, DOB [birth year], MRN [id number]  PCP:  Olive Bass, MD  Cardiologist:  Norman Herrlich, MD    Referring MD: Olive Bass, MD    ASSESSMENT:    1. Paroxysmal atrial fibrillation (HCC)   2. Chronic anticoagulation   3. Second degree AV block   4. Presence of permanent cardiac pacemaker   5. Hypertensive heart disease with chronic diastolic congestive heart failure (HCC)   6. Nonrheumatic mitral valve regurgitation   7. Mixed hyperlipidemia    PLAN:    In order of problems listed above:  She continues to do well maintaining sinus rhythm dual-chamber paced pacemaker dependent will continue to follow in our device clinic with normal function Blood pressure is at target heart failure is nicely compensated she will continue her current medications including amlodipine carvedilol and her loop diuretic Stable her mitral regurgitation is not severe Continue her statin check lipid profile CMP   Next appointment: 6 months   Medication Adjustments/Labs and Tests Ordered: Current medicines are reviewed at length with the patient today.  Concerns regarding medicines are outlined above.  Orders Placed This Encounter  Procedures   EKG 12-Lead   EKG 12-Lead   No orders of the defined types were placed in this encounter.    History of Present Illness:    Felicia Arnold is a 82 y.o. female with a hx of paroxysmal atrial fibrillation with chronic anticoagulation second-degree heart block or permanent pacemaker hypertensive heart disease with chronic diastolic heart failure and functional mitral regurgitation.  Last seen 01/20/2022. Compliance with diet, lifestyle and medications: Yes  She has done well and has had no cardiovascular symptoms of edema shortness of breath chest pain palpitation or syncope He is very active gardening and is pleased with the quality of her life She tolerates her statin without muscle pain or  weakness She has had no bleeding with her anticoagulant Past Medical History:  Diagnosis Date   Abnormal CBC 06/09/2017   Ambulatory dysfunction 12/16/2015   Atrial flutter (HCC) 01/17/2016   Atrophic vaginitis 03/25/2016   AV block 11/10/2015   Body mass index (bmi) 32.0-32.9, adult 03/25/2016   Cardiac pacemaker 11/11/2015   Overview:  dual-chamber [employer] Medical pacemaker with the model number of the ASSURITY [id number] and serial number of the [id number]   Chronic anticoagulation 04/17/2016   Chronic atrial fibrillation (HCC) 01/17/2016   Chronic diastolic heart failure (HCC) 01/17/2016   Dermatophytosis, nail 03/25/2016   Dizziness 06/09/2017   Gait disturbance 11/09/2015   Generalized muscle weakness 03/25/2016   Hammer toe 03/25/2016   Hearing loss 03/25/2016   Hyperlipidemia 11/09/2015   Hypertension, benign 06/06/2017   Hypertensive heart disease with CHF (congestive heart failure) (HCC) 02/03/2016   LBBB (left bundle branch block) 01/17/2016   Leukopenia 11/09/2015   LVH (left ventricular hypertrophy)    Macular degeneration 06/09/2017   MCI (mild cognitive impairment) 02/03/2016   Mitral regurgitation    Osteopenia 11/09/2015   Presence of cardiac pacemaker 11/11/2015   Primary osteoarthritis involving multiple joints 11/09/2015   Screening for diabetes mellitus (DM) 06/06/2017   Second degree atrioventricular block 12/16/2015   Second-degree heart block 07/10/2017   Stress incontinence 06/09/2017   Vitamin D insufficiency 06/09/2017    Current Medications: No outpatient medications have been marked as taking for the 04/14/23 encounter (Office Visit) with Baldo Daub, MD.      EKGs/Labs/Other Studies Reviewed:  The following studies were reviewed today:  Cardiac Studies & Procedures       ECHOCARDIOGRAM  ECHOCARDIOGRAM COMPLETE 03/21/2019  Narrative ECHOCARDIOGRAM REPORT    Patient Name:   Felicia Arnold Date of Exam: 03/21/2019 Medical Rec #:  [id number]    Height:       64.0  in Accession #:    [id number]   Weight:       176.0 lb Date of Birth:  10-24-1940    BSA:          1.85 m Patient Age:    78 years     BP:           138/70 mmHg Patient Gender: F            HR:           61 bpm. Exam Location:  [facility]   Procedure: 2D Echo  Indications:    A-FIB, HTN, CHRONIC DIASTOLIC HF  History:        Patient has no prior history of Echocardiogram examinations. CHF Atrial Fibrillation, Atrial Flutter and LBBB Risk Factors: Dyslipidemia and Hypertension.  Sonographer:    Tor Netters RDCS (AE) Referring Phys: [id number] Gerilyn Stargell J Bryla Burek  IMPRESSIONS   1. The left ventricle has normal systolic function with an ejection fraction of 60-65%. The cavity size was normal. Left ventricular diastolic Doppler parameters are consistent with impaired relaxation. 2. The right ventricle has normal systolic function. The cavity was normal. There is no increase in right ventricular wall thickness. 3. Left atrial size was moderately dilated. 4. The mitral valve is grossly normal. Mitral valve regurgitation is moderate by color flow Doppler. 5. The aortic valve is grossly normal. Aortic valve regurgitation was not assessed by color flow Doppler.  SUMMARY  Rhythm was sinus. FINDINGS Left Ventricle: The left ventricle has normal systolic function, with an ejection fraction of 60-65%. The cavity size was normal. There is no increase in left ventricular wall thickness. Left ventricular diastolic Doppler parameters are consistent with impaired relaxation.  Right Ventricle: The right ventricle has normal systolic function. The cavity was normal. There is no increase in right ventricular wall thickness.  Left Atrium: Left atrial size was moderately dilated.  Right Atrium: Right atrial size was normal in size. Right atrial pressure is estimated at 3 mmHg.  Interatrial Septum: No atrial level shunt detected by color flow Doppler.  Pericardium: There is no evidence of pericardial  effusion.  Mitral Valve: The mitral valve is grossly normal. Mitral valve regurgitation is moderate by color flow Doppler.  Tricuspid Valve: The tricuspid valve is normal in structure. Tricuspid valve regurgitation is mild by color flow Doppler.  Aortic Valve: The aortic valve is grossly normal Aortic valve regurgitation was not assessed by color flow Doppler.  Pulmonic Valve: The pulmonic valve was not well visualized. Pulmonic valve regurgitation was not assessed by color flow Doppler.  Venous: The inferior vena cava measures 1.70 cm, is normal in size with greater than 50% respiratory variability.   +--------------+--------++ LEFT VENTRICLE         +----------------+---------++ +--------------+--------++ Diastology                PLAX 2D                +----------------+---------++ +--------------+--------++ LV e' lateral:  6.53 cm/s LVIDd:        5.10 cm  +----------------+---------++ +--------------+--------++ LV E/e' lateral:14.6      LVIDs:  2.90 cm  +----------------+---------++ +--------------+--------++ LV e' medial:   5.66 cm/s LV PW:        1.00 cm  +----------------+---------++ +--------------+--------++ LV E/e' medial: 16.9      LV IVS:       1.30 cm  +----------------+---------++ +--------------+--------++ LVOT diam:    1.90 cm  +--------------+--------++ LV SV:        92 ml    +--------------+--------++ LV SV Index:  47.58    +--------------+--------++ LVOT Area:    2.84 cm +--------------+--------++                        +--------------+--------++  +---------------+----------++ RIGHT VENTRICLE           +---------------+----------++ RV Basal diam: 2.90 cm    +---------------+----------++ RV S prime:    11.90 cm/s +---------------+----------++ TAPSE (M-mode):2.0 cm     +---------------+----------++  +---------------+-------++-----------++ LEFT ATRIUM            Index       +---------------+-------++-----------++ LA diam:       4.10 cm2.21 cm/m  +---------------+-------++-----------++ LA Vol (A2C):  82.6 ml44.58 ml/m +---------------+-------++-----------++ LA Vol (A4C):  70.7 ml38.16 ml/m +---------------+-------++-----------++ LA Biplane Vol:77.2 ml41.66 ml/m +---------------+-------++-----------++ +------------+---------++-----------++ RIGHT ATRIUM         Index       +------------+---------++-----------++ RA Area:    16.10 cm            +------------+---------++-----------++ RA Volume:  44.50 ml 24.02 ml/m +------------+---------++-----------++ +------------+-----------++ AORTIC VALVE            +------------+-----------++ LVOT Vmax:  91.70 cm/s  +------------+-----------++ LVOT Vmean: 63.800 cm/s +------------+-----------++ LVOT VTI:   0.244 m     +------------+-----------++  +-------------+-------++ AORTA                +-------------+-------++ Ao Root diam:2.60 cm +-------------+-------++  +--------------+----------++ MITRAL VALVE                +--------------+-------+ +--------------+----------++    SHUNTS                MV Area (PHT):4.36 cm      +--------------+-------+ +--------------+----------++    Systemic VTI: 0.24 m  MV PHT:       50.46 msec    +--------------+-------+ +--------------+----------++    Systemic Diam:1.90 cm MV Decel Time:174 msec      +--------------+-------+ +--------------+----------++ +----------------+-----------++ MR Peak grad:   145.4 mmHg  +----------------+-----------++ MR Mean grad:   78.0 mmHg   +----------------+-----------++ MR Vmax:        603.00 cm/s +----------------+-----------++ MR Vmean:       399.0 cm/s  +----------------+-----------++ MR PISA:        6.28 cm    +----------------+-----------++ MR PISA Eff ROA:96 mm       +----------------+-----------++ MR PISA Radius: 1.00 cm     +----------------+-----------++ +--------------+-----------++ MV E velocity:95.60 cm/s  +--------------+-----------++ MV A velocity:125.00 cm/s +--------------+-----------++ MV E/A ratio: 0.76        +--------------+-----------++  +---------+-------+ IVC              +---------+-------+ IVC diam:1.70 cm +---------+-------+   Belva Crome MD Electronically signed by Belva Crome MD Signature Date/Time: 03/21/2019/12:56:31 PM    Final            EKG Interpretation Date/Time:  Friday April 14 2023 15:12:04 EDT Ventricular Rate:  61 PR Interval:  208 QRS Duration:  180 QT Interval:  480 QTC Calculation: 483 R Axis:   -  60  Text Interpretation: Atrial-sensed ventricular-paced rhythm Abnormal ECG No previous ECGs available Confirmed by Norman Herrlich [phone]) on 04/14/2023 3:15:11 PM   EKG Interpretation Date/Time:  Friday April 14 2023 15:12:04 EDT Ventricular Rate:  61 PR Interval:  208 QRS Duration:  180 QT Interval:  480 QTC Calculation: 483 R Axis:   -60  Text Interpretation: Atrial-sensed ventricular-paced rhythm Abnormal ECG No previous ECGs available Confirmed by Norman Herrlich [phone]) on 04/14/2023 3:15:11 PM   Recent Labs: 09/02/2022: BUN 16; Creatinine, Ser 0.68; Potassium 3.6; Sodium 141  Recent Lipid Panel    Component Value Date/Time   CHOL 154 06/03/2021 1313   TRIG 66 06/03/2021 1313   HDL 67 06/03/2021 1313   CHOLHDL 2.3 06/03/2021 1313   LDLCALC 74 06/03/2021 1313    Physical Exam:    VS:  BP 120/70 (BP Location: Right Arm, Patient Position: Sitting, Cuff Size: Normal)   Pulse 61   Ht 5\' 4"  (1.626 m)   Wt 141 lb 12.8 oz (64.3 kg)   SpO2 98%   BMI 24.34 kg/m     Wt Readings from Last 3 Encounters:  04/14/23 141 lb 12.8 oz (64.3 kg)  01/20/22 154 lb 12.8 oz (70.2 kg)  06/03/21 163 lb (73.9 kg)     GEN:  Well nourished, well developed in no acute  distress HEENT: Normal NECK: No JVD; No carotid bruits LYMPHATICS: No lymphadenopathy CARDIAC: RRR, no murmurs, rubs, gallops RESPIRATORY:  Clear to auscultation without rales, wheezing or rhonchi  ABDOMEN: Soft, non-tender, non-distended MUSCULOSKELETAL:  No edema; No deformity  SKIN: Warm and dry NEUROLOGIC:  Alert and oriented x 3 PSYCHIATRIC:  Normal affect    Signed, Norman Herrlich, MD  04/14/2023 3:28 PM    [facility]

## 2023-04-14 ENCOUNTER — Encounter: Payer: Self-pay | Admitting: Cardiology

## 2023-04-14 ENCOUNTER — Ambulatory Visit: Payer: Medicare HMO | Attending: Cardiology | Admitting: Cardiology

## 2023-04-14 VITALS — BP 120/70 | HR 61 | Ht 64.0 in | Wt 141.8 lb

## 2023-04-14 DIAGNOSIS — E782 Mixed hyperlipidemia: Secondary | ICD-10-CM

## 2023-04-14 DIAGNOSIS — I441 Atrioventricular block, second degree: Secondary | ICD-10-CM | POA: Diagnosis not present

## 2023-04-14 DIAGNOSIS — Z950 Presence of cardiac pacemaker: Secondary | ICD-10-CM

## 2023-04-14 DIAGNOSIS — I110 Hypertensive heart disease with heart failure: Secondary | ICD-10-CM

## 2023-04-14 DIAGNOSIS — Z7901 Long term (current) use of anticoagulants: Secondary | ICD-10-CM | POA: Diagnosis not present

## 2023-04-14 DIAGNOSIS — I480 Paroxysmal atrial fibrillation: Secondary | ICD-10-CM

## 2023-04-14 DIAGNOSIS — I5032 Chronic diastolic (congestive) heart failure: Secondary | ICD-10-CM

## 2023-04-14 DIAGNOSIS — I340 Nonrheumatic mitral (valve) insufficiency: Secondary | ICD-10-CM

## 2023-04-14 NOTE — Patient Instructions (Signed)
Medication Instructions:  Your physician recommends that you continue on your current medications as directed. Please refer to the Current Medication list given to you today.  *If you need a refill on your cardiac medications before your next appointment, please call your pharmacy*   Lab Work: Your physician recommends that you return for lab work in: CMP and Lipid Profile today If you have labs (blood work) drawn today and your tests are completely normal, you will receive your results only by: MyChart Message (if you have MyChart) OR A paper copy in the mail If you have any lab test that is abnormal or we need to change your treatment, we will call you to review the results.   Testing/Procedures: None   Follow-Up: At [facility], you and your health needs are our priority.  As part of our continuing mission to provide you with exceptional heart care, we have created designated Provider Care Teams.  These Care Teams include your primary Cardiologist (physician) and Advanced Practice Providers (APPs -  Physician Assistants and Nurse Practitioners) who all work together to provide you with the care you need, when you need it.  We recommend signing up for the patient portal called "MyChart".  Sign up information is provided on this After Visit Summary.  MyChart is used to connect with patients for Virtual Visits (Telemedicine).  Patients are able to view lab/test results, encounter notes, upcoming appointments, etc.  Non-urgent messages can be sent to your provider as well.   To learn more about what you can do with MyChart, go to [email or web address].    Your next appointment:   6 month(s)  Provider:   Norman Herrlich, MD    Other Instructions

## 2023-04-14 NOTE — Addendum Note (Signed)
Addended by: Heywood Bene on: 04/14/2023 03:40 PM   Modules accepted: Orders

## 2023-04-15 LAB — COMPREHENSIVE METABOLIC PANEL
ALT: 24 IU/L (ref 0–32)
AST: 46 IU/L — ABNORMAL HIGH (ref 0–40)
Albumin: 4.3 g/dL (ref 3.7–4.7)
Alkaline Phosphatase: 92 IU/L (ref 44–121)
BUN/Creatinine Ratio: 19 (ref 12–28)
BUN: 13 mg/dL (ref 8–27)
Bilirubin Total: 0.7 mg/dL (ref 0.0–1.2)
CO2: 30 mmol/L — ABNORMAL HIGH (ref 20–29)
Calcium: 10.6 mg/dL — ABNORMAL HIGH (ref 8.7–10.3)
Chloride: 100 mmol/L (ref 96–106)
Creatinine, Ser: 0.7 mg/dL (ref 0.57–1.00)
Globulin, Total: 2.6 g/dL (ref 1.5–4.5)
Glucose: 90 mg/dL (ref 70–99)
Potassium: 3.8 mmol/L (ref 3.5–5.2)
Sodium: 141 mmol/L (ref 134–144)
Total Protein: 6.9 g/dL (ref 6.0–8.5)
eGFR: 86 mL/min/{1.73_m2} (ref >59)

## 2023-04-15 LAB — LIPID PANEL
Chol/HDL Ratio: 2.3 ratio (ref 0.0–4.4)
Cholesterol, Total: 173 mg/dL (ref 100–199)
HDL: 74 mg/dL (ref >39)
LDL Chol Calc (NIH): 84 mg/dL (ref 0–99)
Triglycerides: 83 mg/dL (ref 0–149)
VLDL Cholesterol Cal: 15 mg/dL (ref 5–40)

## 2023-04-17 ENCOUNTER — Telehealth: Payer: Self-pay | Admitting: Cardiology

## 2023-04-17 NOTE — Telephone Encounter (Signed)
Results reviewed with pt as per Dr. Munley's note.  Pt verbalized understanding and had no additional questions. Routed to PCP  

## 2023-04-17 NOTE — Telephone Encounter (Signed)
Patient is calling with questions about EKG results. Please advise.

## 2023-04-19 ENCOUNTER — Other Ambulatory Visit: Payer: Self-pay | Admitting: Cardiology

## 2023-04-25 ENCOUNTER — Ambulatory Visit (INDEPENDENT_AMBULATORY_CARE_PROVIDER_SITE_OTHER): Payer: Medicare HMO

## 2023-04-25 DIAGNOSIS — I480 Paroxysmal atrial fibrillation: Secondary | ICD-10-CM

## 2023-04-25 DIAGNOSIS — I441 Atrioventricular block, second degree: Secondary | ICD-10-CM

## 2023-04-25 LAB — CUP PACEART REMOTE DEVICE CHECK
Battery Remaining Longevity: 28 mo
Battery Remaining Percentage: 24 %
Battery Voltage: 2.86 V
Brady Statistic AP VP Percent: 65 %
Brady Statistic AP VS Percent: 1 %
Brady Statistic AS VP Percent: 34 %
Brady Statistic AS VS Percent: 1 %
Brady Statistic RA Percent Paced: 62 %
Brady Statistic RV Percent Paced: 99 %
Date Time Interrogation Session: 20240730040014
Implantable Lead Connection Status: 753985
Implantable Lead Connection Status: 753985
Implantable Lead Implant Date: 20170215
Implantable Lead Implant Date: 20170215
Implantable Lead Location: 753859
Implantable Lead Location: 753860
Implantable Pulse Generator Implant Date: 20170215
Lead Channel Impedance Value: 480 Ohm
Lead Channel Impedance Value: 560 Ohm
Lead Channel Pacing Threshold Amplitude: 0.5 V
Lead Channel Pacing Threshold Amplitude: 0.625 V
Lead Channel Pacing Threshold Pulse Width: 0.4 ms
Lead Channel Pacing Threshold Pulse Width: 0.4 ms
Lead Channel Sensing Intrinsic Amplitude: 12 mV
Lead Channel Sensing Intrinsic Amplitude: 4.5 mV
Lead Channel Setting Pacing Amplitude: 0.875
Lead Channel Setting Pacing Amplitude: 2 V
Lead Channel Setting Pacing Pulse Width: 0.4 ms
Lead Channel Setting Sensing Sensitivity: 4 mV
Pulse Gen Model: 2240
Pulse Gen Serial Number: 7863758

## 2023-05-10 ENCOUNTER — Other Ambulatory Visit: Payer: Self-pay | Admitting: Cardiology

## 2023-05-17 NOTE — Progress Notes (Signed)
Remote pacemaker transmission.   

## 2023-07-25 ENCOUNTER — Ambulatory Visit (INDEPENDENT_AMBULATORY_CARE_PROVIDER_SITE_OTHER): Payer: Medicare HMO

## 2023-07-25 DIAGNOSIS — I441 Atrioventricular block, second degree: Secondary | ICD-10-CM | POA: Diagnosis not present

## 2023-07-27 LAB — CUP PACEART REMOTE DEVICE CHECK
Battery Remaining Longevity: 24 mo
Battery Remaining Percentage: 21 %
Battery Voltage: 2.84 V
Brady Statistic AP VP Percent: 66 %
Brady Statistic AP VS Percent: 1 %
Brady Statistic AS VP Percent: 33 %
Brady Statistic AS VS Percent: 1 %
Brady Statistic RA Percent Paced: 63 %
Brady Statistic RV Percent Paced: 99 %
Date Time Interrogation Session: 20241029040014
Implantable Lead Connection Status: 753985
Implantable Lead Connection Status: 753985
Implantable Lead Implant Date: 20170215
Implantable Lead Implant Date: 20170215
Implantable Lead Location: 753859
Implantable Lead Location: 753860
Implantable Pulse Generator Implant Date: 20170215
Lead Channel Impedance Value: 380 Ohm
Lead Channel Impedance Value: 490 Ohm
Lead Channel Pacing Threshold Amplitude: 0.5 V
Lead Channel Pacing Threshold Amplitude: 0.5 V
Lead Channel Pacing Threshold Pulse Width: 0.4 ms
Lead Channel Pacing Threshold Pulse Width: 0.4 ms
Lead Channel Sensing Intrinsic Amplitude: 12 mV
Lead Channel Sensing Intrinsic Amplitude: 4 mV
Lead Channel Setting Pacing Amplitude: 0.75 V
Lead Channel Setting Pacing Amplitude: 2 V
Lead Channel Setting Pacing Pulse Width: 0.4 ms
Lead Channel Setting Sensing Sensitivity: 4 mV
Pulse Gen Model: 2240
Pulse Gen Serial Number: 7863758

## 2023-08-05 ENCOUNTER — Other Ambulatory Visit: Payer: Self-pay | Admitting: Cardiology

## 2023-08-15 NOTE — Progress Notes (Signed)
Remote pacemaker transmission.   

## 2023-08-27 ENCOUNTER — Other Ambulatory Visit: Payer: Self-pay | Admitting: Cardiology

## 2023-09-04 ENCOUNTER — Telehealth: Payer: Self-pay | Admitting: Cardiology

## 2023-09-04 ENCOUNTER — Other Ambulatory Visit: Payer: Self-pay | Admitting: Cardiology

## 2023-09-04 NOTE — Telephone Encounter (Signed)
Left vm to return our call  

## 2023-09-04 NOTE — Telephone Encounter (Signed)
Pt c/o medication issue:  1. Name of Medication:   apixaban (ELIQUIS) 5 MG TABS tablet    2. How are you currently taking this medication (dosage and times per day)?   3. Are you having a reaction (difficulty breathing--STAT)?   4. What is your medication issue?   Patient would like to know if she should still be taking Eliquis. Please advise.

## 2023-09-04 NOTE — Telephone Encounter (Signed)
Prescription refill request for Eliquis received. Indication: PAF Last office visit: 04/14/23  Caryl Pina MD Scr: 0.70 on 04/14/23  Epic Age: 82 Weight: 64.3kg  Based on above findings Eliquis 5mg  twice daily is the appropriate dose.  Refill approved.

## 2023-09-04 NOTE — Telephone Encounter (Signed)
Advised that she is to continue Eliquis. Pt states she is out of refills.

## 2023-09-05 ENCOUNTER — Other Ambulatory Visit: Payer: Self-pay

## 2023-09-05 MED ORDER — APIXABAN 5 MG PO TABS: 5.0000 mg | ORAL_TABLET | Freq: Two times a day (BID) | ORAL | 1 refills | Status: DC

## 2023-09-05 NOTE — Telephone Encounter (Signed)
Prescription refill request for Eliquis received. Indication:afib Last office visit:7/24 Scr:0.70  7/24 Age: 82 Weight:64.3  kg  Prescription refilled

## 2023-10-16 ENCOUNTER — Encounter: Payer: Self-pay | Admitting: Cardiology

## 2023-10-16 NOTE — Progress Notes (Unsigned)
Cardiology Office Note:    Date:  10/17/2023   ID:  Felicia Arnold, DOB 14-Feb-1941, MRN [id number]  PCP:  Olive Bass, MD  Cardiologist:  Norman Herrlich, MD    Referring MD: Olive Bass, MD    ASSESSMENT:    1. Paroxysmal atrial fibrillation (HCC)   2. Chronic anticoagulation   3. Second degree AV block   4. Presence of permanent cardiac pacemaker   5. Hypertensive heart disease with chronic diastolic congestive heart failure (HCC)   6. Nonrheumatic mitral valve regurgitation   7. Mixed hyperlipidemia    PLAN:    In order of problems listed above:  She continues to do well not having recurrent atrial fibrillation pacemaker dependent continue her current beta-blocker also effective for hypertension anticoagulant Watch her weight closely I suspect at her next visit we will need to decrease the dose to 2.5 twice daily of Eliquis once her weight drops down to 132 pounds. Heart failure is nicely compensated no edema will check labs today including renal function potassium and continue her current loop diuretic and carvedilol with good blood pressure control Stable mitral regurgitation Currently not on lipid-lowering therapy   Next appointment: 6 months   Medication Adjustments/Labs and Tests Ordered: Current medicines are reviewed at length with the patient today.  Concerns regarding medicines are outlined above.  No orders of the defined types were placed in this encounter.  No orders of the defined types were placed in this encounter.    History of Present Illness:    Felicia Arnold is a 83 y.o. female with a hx of atrial fibrillation with chronic anticoagulation second-degree heart block with permanent pacemaker followed in our device clinic hypertensive heart disease with chronic diastolic heart failure hyperlipidemia and functional mitral regurgitation last seen 04/14/2023.  Her last pacemaker check in October showed 21% residual battery projected 24 months.  She is  ventricularly paced 99% of the time she had 9 episodes of brief high atrial rate but no persistent atrial fibrillation  Compliance with diet, lifestyle and medications: Yes  Overall she is doing well she lives independently she has a good circle of friends and they contact each other and check in on each other every day Uses a walker and has had no falls She is pacemaker dependent but has no awareness and she is not having cardiovascular symptoms of edema shortness of breath chest pain palpitation or syncope I rechecked her blood pressure right upper extremity sitting resting 120/80 Past Medical History:  Diagnosis Date   Abnormal CBC 06/09/2017   Ambulatory dysfunction 12/16/2015   Atrial flutter (HCC) 01/17/2016   Atrophic vaginitis 03/25/2016   AV block 11/10/2015   Body mass index (bmi) 32.0-32.9, adult 03/25/2016   Body mass index (BMI) of 32.0 to 32.9 in adult 03/25/2016   IMO SNOMED Dx Update Oct 2024     Cardiac pacemaker 11/11/2015   Overview:  dual-chamber [employer] pacemaker with the model number of the ASSURITY [id number] and serial number of the [id number]   Chest pain 12/12/2017   2019     Chronic anticoagulation 04/17/2016   Chronic atrial fibrillation (HCC) 01/17/2016   Chronic diastolic heart failure (HCC) 01/17/2016   Dermatophytosis, nail 03/25/2016   Dizziness 06/09/2017   Elevated liver function tests 12/13/2017   2019     Essential hypertension 07/20/2019   Gait disturbance 11/09/2015   Generalized muscle weakness 03/25/2016   Hammer toe 03/25/2016   Hearing loss 03/25/2016   Hyperlipidemia  11/09/2015   Hypertension, benign 06/06/2017   Hypertensive heart disease with CHF (congestive heart failure) (HCC) 02/03/2016   Hypertensive heart disease with chronic diastolic congestive heart failure (HCC) 02/03/2016   LBBB (left bundle branch block) 01/17/2016   Leukopenia 11/09/2015   LVH (left ventricular hypertrophy)    Macular degeneration 06/09/2017   MCI  (mild cognitive impairment) 02/03/2016   Mitral regurgitation    Onychomycosis due to dermatophyte 03/25/2016   Osteopenia 11/09/2015   Peripheral neuropathy 11/09/2015   Post-menopausal 08/21/2017   Prediabetes 06/06/2017   Presence of cardiac pacemaker 11/11/2015   Primary osteoarthritis involving multiple joints 11/09/2015   Screening for diabetes mellitus (DM) 06/06/2017   Second degree atrioventricular block 12/16/2015   Second-degree heart block 07/10/2017   Stress incontinence 06/09/2017   Vitamin D insufficiency 06/09/2017    Current Medications: Current Meds  Medication Sig   acetaminophen (TYLENOL) 650 MG CR tablet Take 650 mg by mouth as needed for pain.   amLODipine (NORVASC) 5 MG tablet Take 1 tablet (5 mg total) by mouth daily.   apixaban (ELIQUIS) 5 MG TABS tablet Take 1 tablet (5 mg total) by mouth 2 (two) times daily.   atorvastatin (LIPITOR) 10 MG tablet TAKE 1 TABLET BY MOUTH EVERY DAY   BIOTIN 5000 PO Take daily by mouth.   Calcium Carb-Cholecalciferol (CALCIUM-VITAMIN D) 500-200 MG-UNIT tablet Take 1 tablet by mouth daily.    carvedilol (COREG) 6.25 MG tablet TAKE 1 TABLET BY MOUTH 2 TIMES DAILY WITH A MEAL.   furosemide (LASIX) 40 MG tablet TAKE 1 TABLET BY MOUTH TWICE A DAY   Misc Natural Products (NEURIVA PO) Take 1 tablet by mouth daily.      EKGs/Labs/Other Studies Reviewed:    The following studies were reviewed today:  Cardiac Studies & Procedures      ECHOCARDIOGRAM  ECHOCARDIOGRAM COMPLETE 03/21/2019  Narrative ECHOCARDIOGRAM REPORT    Patient Name:   Felicia Arnold Date of Exam: 03/21/2019 Medical Rec #:  [id number]    Height:       64.0 in Accession #:    [id number]   Weight:       176.0 lb Date of Birth:  Jun 12, 1941    BSA:          1.85 m Patient Age:    78 years     BP:           138/70 mmHg Patient Gender: F            HR:           61 bpm. Exam Location:  [place]   Procedure: 2D Echo  Indications:    A-FIB, HTN, CHRONIC  DIASTOLIC HF  History:        Patient has no prior history of Echocardiogram examinations. CHF Atrial Fibrillation, Atrial Flutter and LBBB Risk Factors: Dyslipidemia and Hypertension.  Sonographer:    Tor Netters RDCS (AE) Referring Phys: [id number] [place] Rylander J Apollos Tenbrink  IMPRESSIONS   1. The left ventricle has normal systolic function with an ejection fraction of 60-65%. The cavity size was normal. Left ventricular diastolic Doppler parameters are consistent with impaired relaxation. 2. The right ventricle has normal systolic function. The cavity was normal. There is no increase in right ventricular wall thickness. 3. Left atrial size was moderately dilated. 4. The mitral valve is grossly normal. Mitral valve regurgitation is moderate by color flow Doppler. 5. The aortic valve is grossly normal. Aortic valve regurgitation was not assessed by color flow Doppler.  SUMMARY  Rhythm was sinus. FINDINGS Left Ventricle: The left ventricle has normal systolic function, with an ejection fraction of 60-65%. The cavity size was normal. There is no increase in left ventricular wall thickness. Left ventricular diastolic Doppler parameters are consistent with impaired relaxation.  Right Ventricle: The right ventricle has normal systolic function. The cavity was normal. There is no increase in right ventricular wall thickness.  Left Atrium: Left atrial size was moderately dilated.  Right Atrium: Right atrial size was normal in size. Right atrial pressure is estimated at 3 mmHg.  Interatrial Septum: No atrial level shunt detected by color flow Doppler.  Pericardium: There is no evidence of pericardial effusion.  Mitral Valve: The mitral valve is grossly normal. Mitral valve regurgitation is moderate by color flow Doppler.  Tricuspid Valve: The tricuspid valve is normal in structure. Tricuspid valve regurgitation is mild by color flow Doppler.  Aortic Valve: The aortic valve is grossly normal Aortic  valve regurgitation was not assessed by color flow Doppler.  Pulmonic Valve: The pulmonic valve was not well visualized. Pulmonic valve regurgitation was not assessed by color flow Doppler.  Venous: The inferior vena cava measures 1.70 cm, is normal in size with greater than 50% respiratory variability.   +--------------+--------++ LEFT VENTRICLE         +----------------+---------++ +--------------+--------++ Diastology                PLAX 2D                +----------------+---------++ +--------------+--------++ LV e' lateral:  6.53 cm/s LVIDd:        5.10 cm  +----------------+---------++ +--------------+--------++ LV E/e' lateral:14.6      LVIDs:        2.90 cm  +----------------+---------++ +--------------+--------++ LV e' medial:   5.66 cm/s LV PW:        1.00 cm  +----------------+---------++ +--------------+--------++ LV E/e' medial: 16.9      LV IVS:       1.30 cm  +----------------+---------++ +--------------+--------++ LVOT diam:    1.90 cm  +--------------+--------++ LV SV:        92 ml    +--------------+--------++ LV SV Index:  47.58    +--------------+--------++ LVOT Area:    2.84 cm +--------------+--------++                        +--------------+--------++  +---------------+----------++ RIGHT VENTRICLE           +---------------+----------++ RV Basal diam: 2.90 cm    +---------------+----------++ RV S prime:    11.90 cm/s +---------------+----------++ TAPSE (M-mode):2.0 cm     +---------------+----------++  +---------------+-------++-----------++ LEFT ATRIUM           Index       +---------------+-------++-----------++ LA diam:       4.10 cm2.21 cm/m  +---------------+-------++-----------++ LA Vol (A2C):  82.6 ml44.58 ml/m +---------------+-------++-----------++ LA Vol (A4C):  70.7 ml38.16 ml/m +---------------+-------++-----------++ LA  Biplane Vol:77.2 ml41.66 ml/m +---------------+-------++-----------++ +------------+---------++-----------++ RIGHT ATRIUM         Index       +------------+---------++-----------++ RA Area:    16.10 cm            +------------+---------++-----------++ RA Volume:  44.50 ml 24.02 ml/m +------------+---------++-----------++ +------------+-----------++ AORTIC VALVE            +------------+-----------++ LVOT Vmax:  91.70 cm/s  +------------+-----------++ LVOT Vmean: 63.800 cm/s +------------+-----------++ LVOT VTI:   0.244 m     +------------+-----------++  +-------------+-------++ AORTA                +-------------+-------++  Ao Root diam:2.60 cm +-------------+-------++  +--------------+----------++ MITRAL VALVE                +--------------+-------+ +--------------+----------++    SHUNTS                MV Area (PHT):4.36 cm      +--------------+-------+ +--------------+----------++    Systemic VTI: 0.24 m  MV PHT:       50.46 msec    +--------------+-------+ +--------------+----------++    Systemic Diam:1.90 cm MV Decel Time:174 msec      +--------------+-------+ +--------------+----------++ +----------------+-----------++ MR Peak grad:   145.4 mmHg  +----------------+-----------++ MR Mean grad:   78.0 mmHg   +----------------+-----------++ MR Vmax:        603.00 cm/s +----------------+-----------++ MR Vmean:       399.0 cm/s  +----------------+-----------++ MR PISA:        6.28 cm    +----------------+-----------++ MR PISA Eff ROA:96 mm      +----------------+-----------++ MR PISA Radius: 1.00 cm     +----------------+-----------++ +--------------+-----------++ MV E velocity:95.60 cm/s  +--------------+-----------++ MV A velocity:125.00 cm/s +--------------+-----------++ MV E/A ratio: 0.76         +--------------+-----------++  +---------+-------+ IVC              +---------+-------+ IVC diam:1.70 cm +---------+-------+   Belva Crome MD Electronically signed by Belva Crome MD Signature Date/Time: 03/21/2019/12:56:31 PM    Final                 Recent Labs: 04/14/2023: ALT 24; BUN 13; Creatinine, Ser 0.70; Potassium 3.8; Sodium 141  Recent Lipid Panel    Component Value Date/Time   CHOL 173 04/14/2023 2009   TRIG 83 04/14/2023 2009   HDL 74 04/14/2023 2009   CHOLHDL 2.3 04/14/2023 2009   LDLCALC 84 04/14/2023 2009    Physical Exam:    VS:  BP (!) 96/50   Pulse 64   Ht 5\' 4"  (1.626 m)   Wt 138 lb 3.2 oz (62.7 kg)   BMI 23.72 kg/m     Wt Readings from Last 3 Encounters:  10/17/23 138 lb 3.2 oz (62.7 kg)  04/14/23 141 lb 12.8 oz (64.3 kg)  01/20/22 154 lb 12.8 oz (70.2 kg)     GEN: She looks frail we will have to watch her weight closely when she gets to 60 kg 132 pounds decrease the dose of her anticoagulant well nourished, well developed in no acute distress HEENT: Normal NECK: No JVD; No carotid bruits LYMPHATICS: No lymphadenopathy CARDIAC: Variable first heart sound irregular rhythmpacemaker pocket looks good no compromise skin RESPIRATORY:  Clear to auscultation without rales, wheezing or rhonchi  ABDOMEN: Soft, non-tender, non-distended MUSCULOSKELETAL:  No edema; No deformity  SKIN: Warm and dry NEUROLOGIC:  Alert and oriented x 3 PSYCHIATRIC:  Normal affect    Signed, Norman Herrlich, MD  10/17/2023 11:23 AM    [facility]

## 2023-10-17 ENCOUNTER — Encounter: Payer: Self-pay | Admitting: Cardiology

## 2023-10-17 ENCOUNTER — Ambulatory Visit: Payer: Medicare HMO | Attending: Cardiology | Admitting: Cardiology

## 2023-10-17 VITALS — BP 120/80 | HR 64 | Ht 64.0 in | Wt 138.2 lb

## 2023-10-17 DIAGNOSIS — Z950 Presence of cardiac pacemaker: Secondary | ICD-10-CM | POA: Diagnosis not present

## 2023-10-17 DIAGNOSIS — I480 Paroxysmal atrial fibrillation: Secondary | ICD-10-CM

## 2023-10-17 DIAGNOSIS — Z7901 Long term (current) use of anticoagulants: Secondary | ICD-10-CM

## 2023-10-17 DIAGNOSIS — I441 Atrioventricular block, second degree: Secondary | ICD-10-CM

## 2023-10-17 DIAGNOSIS — E782 Mixed hyperlipidemia: Secondary | ICD-10-CM

## 2023-10-17 DIAGNOSIS — I5032 Chronic diastolic (congestive) heart failure: Secondary | ICD-10-CM

## 2023-10-17 DIAGNOSIS — I110 Hypertensive heart disease with heart failure: Secondary | ICD-10-CM

## 2023-10-17 DIAGNOSIS — I340 Nonrheumatic mitral (valve) insufficiency: Secondary | ICD-10-CM

## 2023-10-17 NOTE — Patient Instructions (Addendum)
Medication Instructions:  Your physician recommends that you continue on your current medications as directed. Please refer to the Current Medication list given to you today.  *If you need a refill on your cardiac medications before your next appointment, please call your pharmacy*   Lab Work: Your physician recommends that you return for lab work in:   Labs today: CMP, Lipids, CBC  If you have labs (blood work) drawn today and your tests are completely normal, you will receive your results only by: MyChart Message (if you have MyChart) OR A paper copy in the mail If you have any lab test that is abnormal or we need to change your treatment, we will call you to review the results.   Testing/Procedures: None   Follow-Up: At [facility], you and your health needs are our priority.  As part of our continuing mission to provide you with exceptional heart care, we have created designated Provider Care Teams.  These Care Teams include your primary Cardiologist (physician) and Advanced Practice Providers (APPs -  Physician Assistants and Nurse Practitioners) who all work together to provide you with the care you need, when you need it.  We recommend signing up for the patient portal called "MyChart".  Sign up information is provided on this After Visit Summary.  MyChart is used to connect with patients for Virtual Visits (Telemedicine).  Patients are able to view lab/test results, encounter notes, upcoming appointments, etc.  Non-urgent messages can be sent to your provider as well.   To learn more about what you can do with MyChart, go to [email or web address].    Your next appointment:   6 month(s)  Provider:   Wallis Bamberg NP  Other Instructions None

## 2023-10-18 LAB — COMPREHENSIVE METABOLIC PANEL
ALT: 24 [IU]/L (ref 0–32)
AST: 36 [IU]/L (ref 0–40)
Albumin: 4.1 g/dL (ref 3.7–4.7)
Alkaline Phosphatase: 98 [IU]/L (ref 44–121)
BUN/Creatinine Ratio: 17 (ref 12–28)
BUN: 12 mg/dL (ref 8–27)
Bilirubin Total: 0.5 mg/dL (ref 0.0–1.2)
CO2: 30 mmol/L — ABNORMAL HIGH (ref 20–29)
Calcium: 10.4 mg/dL — ABNORMAL HIGH (ref 8.7–10.3)
Chloride: 101 mmol/L (ref 96–106)
Creatinine, Ser: 0.72 mg/dL (ref 0.57–1.00)
Globulin, Total: 2.5 g/dL (ref 1.5–4.5)
Glucose: 97 mg/dL (ref 70–99)
Potassium: 3.8 mmol/L (ref 3.5–5.2)
Sodium: 143 mmol/L (ref 134–144)
Total Protein: 6.6 g/dL (ref 6.0–8.5)
eGFR: 83 mL/min/{1.73_m2} (ref >59)

## 2023-10-18 LAB — CBC
Hematocrit: 40.3 % (ref 34.0–46.6)
Hemoglobin: 13.2 g/dL (ref 11.1–15.9)
MCH: 32 pg (ref 26.6–33.0)
MCHC: 32.8 g/dL (ref 31.5–35.7)
MCV: 98 fL — ABNORMAL HIGH (ref 79–97)
Platelets: 204 10*3/uL (ref 150–450)
RBC: 4.12 x10E6/uL (ref 3.77–5.28)
RDW: 12.8 % (ref 11.7–15.4)
WBC: 4.5 10*3/uL (ref 3.4–10.8)

## 2023-10-18 LAB — LIPID PANEL
Chol/HDL Ratio: 2.5 {ratio} (ref 0.0–4.4)
Cholesterol, Total: 160 mg/dL (ref 100–199)
HDL: 63 mg/dL (ref >39)
LDL Chol Calc (NIH): 81 mg/dL (ref 0–99)
Triglycerides: 89 mg/dL (ref 0–149)
VLDL Cholesterol Cal: 16 mg/dL (ref 5–40)

## 2023-10-24 ENCOUNTER — Ambulatory Visit (INDEPENDENT_AMBULATORY_CARE_PROVIDER_SITE_OTHER): Payer: Medicare HMO

## 2023-10-24 DIAGNOSIS — I441 Atrioventricular block, second degree: Secondary | ICD-10-CM | POA: Diagnosis not present

## 2023-10-24 LAB — CUP PACEART REMOTE DEVICE CHECK
Battery Remaining Longevity: 22 mo
Battery Remaining Percentage: 18 %
Battery Voltage: 2.83 V
Brady Statistic AP VP Percent: 66 %
Brady Statistic AP VS Percent: 1 %
Brady Statistic AS VP Percent: 33 %
Brady Statistic AS VS Percent: 1 %
Brady Statistic RA Percent Paced: 63 %
Brady Statistic RV Percent Paced: 99 %
Date Time Interrogation Session: 20250128040015
Implantable Lead Connection Status: 753985
Implantable Lead Connection Status: 753985
Implantable Lead Implant Date: 20170215
Implantable Lead Implant Date: 20170215
Implantable Lead Location: 753859
Implantable Lead Location: 753860
Implantable Pulse Generator Implant Date: 20170215
Lead Channel Impedance Value: 400 Ohm
Lead Channel Impedance Value: 530 Ohm
Lead Channel Pacing Threshold Amplitude: 0.5 V
Lead Channel Pacing Threshold Amplitude: 0.625 V
Lead Channel Pacing Threshold Pulse Width: 0.4 ms
Lead Channel Pacing Threshold Pulse Width: 0.4 ms
Lead Channel Sensing Intrinsic Amplitude: 12 mV
Lead Channel Sensing Intrinsic Amplitude: 5 mV
Lead Channel Setting Pacing Amplitude: 0.875
Lead Channel Setting Pacing Amplitude: 2 V
Lead Channel Setting Pacing Pulse Width: 0.4 ms
Lead Channel Setting Sensing Sensitivity: 4 mV
Pulse Gen Model: 2240
Pulse Gen Serial Number: 7863758

## 2023-10-25 ENCOUNTER — Other Ambulatory Visit: Payer: Self-pay | Admitting: Cardiology

## 2023-12-04 NOTE — Progress Notes (Signed)
 Remote pacemaker transmission.

## 2024-01-16 ENCOUNTER — Other Ambulatory Visit: Payer: Self-pay | Admitting: Cardiology

## 2024-01-16 NOTE — Telephone Encounter (Signed)
 Prescription sent to pharmacy.

## 2024-01-23 ENCOUNTER — Ambulatory Visit (INDEPENDENT_AMBULATORY_CARE_PROVIDER_SITE_OTHER): Payer: Medicare HMO

## 2024-01-23 DIAGNOSIS — I441 Atrioventricular block, second degree: Secondary | ICD-10-CM | POA: Diagnosis not present

## 2024-01-23 LAB — CUP PACEART REMOTE DEVICE CHECK
Battery Remaining Longevity: 18 mo
Battery Remaining Percentage: 16 %
Battery Voltage: 2.81 V
Brady Statistic AP VP Percent: 67 %
Brady Statistic AP VS Percent: 1 %
Brady Statistic AS VP Percent: 32 %
Brady Statistic AS VS Percent: 1 %
Brady Statistic RA Percent Paced: 64 %
Brady Statistic RV Percent Paced: 99 %
Date Time Interrogation Session: 20250429040015
Implantable Lead Connection Status: 753985
Implantable Lead Connection Status: 753985
Implantable Lead Implant Date: 20170215
Implantable Lead Implant Date: 20170215
Implantable Lead Location: 753859
Implantable Lead Location: 753860
Implantable Pulse Generator Implant Date: 20170215
Lead Channel Impedance Value: 380 Ohm
Lead Channel Impedance Value: 510 Ohm
Lead Channel Pacing Threshold Amplitude: 0.5 V
Lead Channel Pacing Threshold Amplitude: 0.625 V
Lead Channel Pacing Threshold Pulse Width: 0.4 ms
Lead Channel Pacing Threshold Pulse Width: 0.4 ms
Lead Channel Sensing Intrinsic Amplitude: 12 mV
Lead Channel Sensing Intrinsic Amplitude: 4.6 mV
Lead Channel Setting Pacing Amplitude: 0.875
Lead Channel Setting Pacing Amplitude: 2 V
Lead Channel Setting Pacing Pulse Width: 0.4 ms
Lead Channel Setting Sensing Sensitivity: 4 mV
Pulse Gen Model: 2240
Pulse Gen Serial Number: 7863758

## 2024-01-31 ENCOUNTER — Other Ambulatory Visit: Payer: Self-pay | Admitting: Cardiology

## 2024-01-31 NOTE — Telephone Encounter (Signed)
 Prescription sent to pharmacy.

## 2024-02-05 DIAGNOSIS — L603 Nail dystrophy: Secondary | ICD-10-CM | POA: Insufficient documentation

## 2024-03-08 NOTE — Progress Notes (Signed)
 Remote pacemaker transmission.

## 2024-04-23 ENCOUNTER — Ambulatory Visit (INDEPENDENT_AMBULATORY_CARE_PROVIDER_SITE_OTHER): Payer: Medicare HMO

## 2024-04-23 DIAGNOSIS — I441 Atrioventricular block, second degree: Secondary | ICD-10-CM

## 2024-04-24 LAB — CUP PACEART REMOTE DEVICE CHECK
Battery Remaining Longevity: 14 mo
Battery Remaining Percentage: 13 %
Battery Voltage: 2.78 V
Brady Statistic AP VP Percent: 69 %
Brady Statistic AP VS Percent: 1 %
Brady Statistic AS VP Percent: 30 %
Brady Statistic AS VS Percent: 1 %
Brady Statistic RA Percent Paced: 66 %
Brady Statistic RV Percent Paced: 99 %
Date Time Interrogation Session: 20250729040017
Implantable Lead Connection Status: 753985
Implantable Lead Connection Status: 753985
Implantable Lead Implant Date: 20170215
Implantable Lead Implant Date: 20170215
Implantable Lead Location: 753859
Implantable Lead Location: 753860
Implantable Pulse Generator Implant Date: 20170215
Lead Channel Impedance Value: 360 Ohm
Lead Channel Impedance Value: 510 Ohm
Lead Channel Pacing Threshold Amplitude: 0.5 V
Lead Channel Pacing Threshold Amplitude: 0.625 V
Lead Channel Pacing Threshold Pulse Width: 0.4 ms
Lead Channel Pacing Threshold Pulse Width: 0.4 ms
Lead Channel Sensing Intrinsic Amplitude: 12 mV
Lead Channel Sensing Intrinsic Amplitude: 4.1 mV
Lead Channel Setting Pacing Amplitude: 0.875
Lead Channel Setting Pacing Amplitude: 2 V
Lead Channel Setting Pacing Pulse Width: 0.4 ms
Lead Channel Setting Sensing Sensitivity: 4 mV
Pulse Gen Model: 2240
Pulse Gen Serial Number: 7863758

## 2024-04-27 ENCOUNTER — Ambulatory Visit: Payer: Self-pay | Admitting: Cardiology

## 2024-06-26 NOTE — Progress Notes (Signed)
 Remote PPM Transmission

## 2024-06-29 ENCOUNTER — Other Ambulatory Visit: Payer: Self-pay | Admitting: Cardiology

## 2024-07-01 NOTE — Telephone Encounter (Signed)
 Prescription refill request for Eliquis  received. Indication:afib Last office visit:1/25 Scr:0.72  1/25 Age: 83 Weight:62.7  kg  Prescription refilled

## 2024-07-23 ENCOUNTER — Ambulatory Visit: Payer: Medicare HMO

## 2024-07-23 DIAGNOSIS — I480 Paroxysmal atrial fibrillation: Secondary | ICD-10-CM | POA: Diagnosis not present

## 2024-07-24 LAB — CUP PACEART REMOTE DEVICE CHECK
Battery Remaining Longevity: 12 mo
Battery Remaining Percentage: 10 %
Battery Voltage: 2.71 V
Brady Statistic AP VP Percent: 69 %
Brady Statistic AP VS Percent: 1 %
Brady Statistic AS VP Percent: 30 %
Brady Statistic AS VS Percent: 1 %
Brady Statistic RA Percent Paced: 65 %
Brady Statistic RV Percent Paced: 99 %
Date Time Interrogation Session: 20251028040016
Implantable Lead Connection Status: 753985
Implantable Lead Connection Status: 753985
Implantable Lead Implant Date: 20170215
Implantable Lead Implant Date: 20170215
Implantable Lead Location: 753859
Implantable Lead Location: 753860
Implantable Pulse Generator Implant Date: 20170215
Lead Channel Impedance Value: 360 Ohm
Lead Channel Impedance Value: 490 Ohm
Lead Channel Pacing Threshold Amplitude: 0.5 V
Lead Channel Pacing Threshold Amplitude: 0.5 V
Lead Channel Pacing Threshold Pulse Width: 0.4 ms
Lead Channel Pacing Threshold Pulse Width: 0.4 ms
Lead Channel Sensing Intrinsic Amplitude: 12 mV
Lead Channel Sensing Intrinsic Amplitude: 5 mV
Lead Channel Setting Pacing Amplitude: 0.75 V
Lead Channel Setting Pacing Amplitude: 2 V
Lead Channel Setting Pacing Pulse Width: 0.4 ms
Lead Channel Setting Sensing Sensitivity: 4 mV
Pulse Gen Model: 2240
Pulse Gen Serial Number: 7863758

## 2024-07-26 ENCOUNTER — Ambulatory Visit: Payer: Self-pay | Admitting: Cardiology

## 2024-07-30 NOTE — Progress Notes (Signed)
 Remote PPM Transmission

## 2024-08-20 NOTE — Progress Notes (Unsigned)
 Cardiology Office Note:    Date:  08/21/2024   ID:  Felicia Arnold, DOB 18-Aug-1941, MRN [id number]  PCP:  Felicia Lamar CROME, MD  Cardiologist:  Felicia Leiter, MD    Referring MD: Felicia Lamar CROME, MD    ASSESSMENT:    1. Paroxysmal atrial fibrillation (HCC)   2. Chronic anticoagulation   3. Second degree AV block   4. Presence of permanent cardiac pacemaker   5. Hypertensive heart disease with chronic diastolic congestive heart failure (HCC)   6. Nonrheumatic mitral valve regurgitation   7. Mixed hyperlipidemia    PLAN:    In order of problems listed above:  Fortunately not having symptomatic tachycardia although she has atrial tachycardia or atrial flutter admixed with ventricular paced rhythm Will continue to monitor telemetry at this time I would not start an antiarrhythmic drug Continue her anticoagulant she has lost a great deal of weight but is still above the cutoff for reduced dose Eliquis  Normal pacemaker function Stable no fluid overload continue her diuretic dosage and antihypertensive amlodipine  Stable valvular heart disease Check lipid profile today   Next appointment: 9 months   Medication Adjustments/Labs and Tests Ordered: Current medicines are reviewed at length with the patient today.  Concerns regarding medicines are outlined above.  Orders Placed This Encounter  Procedures   EKG 12-Lead   No orders of the defined types were placed in this encounter.    History of Present Illness:    Felicia Arnold is a 83 y.o. female with a hx of paroxysmal atrial fibrillation with chronic anticoagulation heart block with permanent pacemaker hypertensive heart disease or chronic diastolic heart failure mitral regurgitation and hyperlipidemia last seen 10/17/2023.  Compliance with diet, lifestyle and medications: Yes  She feels well and is not having symptomatic tachycardia although today she is having intermittent atrial flutter or atrial tachycardia intermixed with  atrial and ventricular paced rhythm She is not not having rapid ventricular pacing This has not been seen on her downloads previously Not having chest pain shortness of breath palpitation or syncope She is no bleeding complication of her anticoagulant She tolerates her statin without muscle pain or weakness Past Medical History:  Diagnosis Date   Abnormal CBC 06/09/2017   Ambulatory dysfunction 12/16/2015   Atrial flutter (HCC) 01/17/2016   Atrophic vaginitis 03/25/2016   AV block 11/10/2015   Body mass index (BMI) of 32.0 to 32.9 in adult 03/25/2016   IMO SNOMED Dx Update Oct 2024     Cardiac pacemaker 11/11/2015   Overview:  dual-chamber [employer] pacemaker with the model number of the ASSURITY [id number] and serial number of the [id number]   Chest pain 12/12/2017   2019     Chronic anticoagulation 04/17/2016   Chronic atrial fibrillation (HCC) 01/17/2016   Chronic diastolic heart failure (HCC) 01/17/2016   Dermatophytosis, nail 03/25/2016   Dizziness 06/09/2017   Elevated liver function tests 12/13/2017   2019     Essential hypertension 07/20/2019   Gait disturbance 11/09/2015   Generalized muscle weakness 03/25/2016   Hammer toe 03/25/2016   Hearing loss 03/25/2016   Hyperlipidemia 11/09/2015   Hypertension, benign 06/06/2017   Hypertensive heart disease with CHF (congestive heart failure) (HCC) 02/03/2016   Hypertensive heart disease with chronic diastolic congestive heart failure (HCC) 02/03/2016   LBBB (left bundle branch block) 01/17/2016   Leukopenia 11/09/2015   LVH (left ventricular hypertrophy)    Macular degeneration 06/09/2017   MCI (mild cognitive impairment) 02/03/2016   Mitral  regurgitation    Onychomycosis due to dermatophyte 03/25/2016   Osteopenia 11/09/2015   Peripheral neuropathy 11/09/2015   Post-menopausal 08/21/2017   Prediabetes 06/06/2017   Presence of cardiac pacemaker 11/11/2015   Primary osteoarthritis involving multiple joints 11/09/2015    Screening for diabetes mellitus (DM) 06/06/2017   Second degree atrioventricular block 12/16/2015   Second-degree heart block 07/10/2017   Stress incontinence 06/09/2017   Vitamin D insufficiency 06/09/2017    Current Medications: Current Meds  Medication Sig   acetaminophen (TYLENOL) 650 MG CR tablet Take 650 mg by mouth as needed for pain.   amLODipine  (NORVASC ) 5 MG tablet TAKE 1 TABLET (5 MG TOTAL) BY MOUTH DAILY.   atorvastatin (LIPITOR) 10 MG tablet Take 1 tablet (10 mg total) by mouth daily.   BIOTIN 5000 PO Take daily by mouth.   Calcium Carb-Cholecalciferol (CALCIUM-VITAMIN D) 500-200 MG-UNIT tablet Take 1 tablet by mouth daily.    carvedilol  (COREG ) 6.25 MG tablet TAKE 1 TABLET BY MOUTH 2 TIMES DAILY WITH A MEAL.   ELIQUIS  5 MG TABS tablet TAKE 1 TABLET BY MOUTH TWICE A DAY   furosemide  (LASIX ) 40 MG tablet TAKE 1 TABLET BY MOUTH TWICE A DAY   Misc Natural Products (NEURIVA PO) Take 1 tablet by mouth daily.   nitroGLYCERIN  (NITROSTAT ) 0.4 MG SL tablet Place 1 tablet (0.4 mg total) under the tongue every 5 (five) minutes as needed for chest pain.      EKGs/Labs/Other Studies Reviewed:    The following studies were reviewed today:  Cardiac Studies & Procedures   ______________________________________________________________________________________________     ECHOCARDIOGRAM  ECHOCARDIOGRAM COMPLETE 03/21/2019  Narrative ECHOCARDIOGRAM REPORT    Patient Name:   Felicia Arnold Date of Exam: 03/21/2019 Medical Rec #:  [id number]    Height:       64.0 in Accession #:    [id number]   Weight:       176.0 lb Date of Birth:  Apr 02, 1941    BSA:          1.85 m Patient Age:    78 years     BP:           138/70 mmHg Patient Gender: F            HR:           61 bpm. Exam Location:  [facility]   Procedure: 2D Echo  Indications:    A-FIB, HTN, CHRONIC DIASTOLIC HF  History:        Patient has no prior history of Echocardiogram examinations. CHF Atrial Fibrillation,  Atrial Flutter and LBBB Risk Factors: Dyslipidemia and Hypertension.  Sonographer:    Felicia Arnold (AE) Referring Phys: [id number] Felicia Arnold  IMPRESSIONS   1. The left ventricle has normal systolic function with an ejection fraction of 60-65%. The cavity size was normal. Left ventricular diastolic Doppler parameters are consistent with impaired relaxation. 2. The right ventricle has normal systolic function. The cavity was normal. There is no increase in right ventricular wall thickness. 3. Left atrial size was moderately dilated. 4. The mitral valve is grossly normal. Mitral valve regurgitation is moderate by color flow Doppler. 5. The aortic valve is grossly normal. Aortic valve regurgitation was not assessed by color flow Doppler.  SUMMARY  Rhythm was sinus. FINDINGS Left Ventricle: The left ventricle has normal systolic function, with an ejection fraction of 60-65%. The cavity size was normal. There is no increase in left ventricular wall thickness. Left ventricular diastolic Doppler  parameters are consistent with impaired relaxation.  Right Ventricle: The right ventricle has normal systolic function. The cavity was normal. There is no increase in right ventricular wall thickness.  Left Atrium: Left atrial size was moderately dilated.  Right Atrium: Right atrial size was normal in size. Right atrial pressure is estimated at 3 mmHg.  Interatrial Septum: No atrial level shunt detected by color flow Doppler.  Pericardium: There is no evidence of pericardial effusion.  Mitral Valve: The mitral valve is grossly normal. Mitral valve regurgitation is moderate by color flow Doppler.  Tricuspid Valve: The tricuspid valve is normal in structure. Tricuspid valve regurgitation is mild by color flow Doppler.  Aortic Valve: The aortic valve is grossly normal Aortic valve regurgitation was not assessed by color flow Doppler.  Pulmonic Valve: The pulmonic valve was not well  visualized. Pulmonic valve regurgitation was not assessed by color flow Doppler.  Venous: The inferior vena cava measures 1.70 cm, is normal in size with greater than 50% respiratory variability.   +--------------+--------++ LEFT VENTRICLE         +----------------+---------++ +--------------+--------++ Diastology                PLAX 2D                +----------------+---------++ +--------------+--------++ LV e' lateral:  6.53 cm/s LVIDd:        5.10 cm  +----------------+---------++ +--------------+--------++ LV E/e' lateral:14.6      LVIDs:        2.90 cm  +----------------+---------++ +--------------+--------++ LV e' medial:   5.66 cm/s LV PW:        1.00 cm  +----------------+---------++ +--------------+--------++ LV E/e' medial: 16.9      LV IVS:       1.30 cm  +----------------+---------++ +--------------+--------++ LVOT diam:    1.90 cm  +--------------+--------++ LV SV:        92 ml    +--------------+--------++ LV SV Index:  47.58    +--------------+--------++ LVOT Area:    2.84 cm +--------------+--------++                        +--------------+--------++  +---------------+----------++ RIGHT VENTRICLE           +---------------+----------++ RV Basal diam: 2.90 cm    +---------------+----------++ RV S prime:    11.90 cm/s +---------------+----------++ TAPSE (M-mode):2.0 cm     +---------------+----------++  +---------------+-------++-----------++ LEFT ATRIUM           Index       +---------------+-------++-----------++ LA diam:       4.10 cm2.21 cm/m  +---------------+-------++-----------++ LA Vol (A2C):  82.6 ml44.58 ml/m +---------------+-------++-----------++ LA Vol (A4C):  70.7 ml38.16 ml/m +---------------+-------++-----------++ LA Biplane Vol:77.2 ml41.66  ml/m +---------------+-------++-----------++ +------------+---------++-----------++ RIGHT ATRIUM         Index       +------------+---------++-----------++ RA Area:    16.10 cm            +------------+---------++-----------++ RA Volume:  44.50 ml 24.02 ml/m +------------+---------++-----------++ +------------+-----------++ AORTIC VALVE            +------------+-----------++ LVOT Vmax:  91.70 cm/s  +------------+-----------++ LVOT Vmean: 63.800 cm/s +------------+-----------++ LVOT VTI:   0.244 m     +------------+-----------++  +-------------+-------++ AORTA                +-------------+-------++ Ao Root diam:2.60 cm +-------------+-------++  +--------------+----------++ MITRAL VALVE                +--------------+-------+ +--------------+----------++  SHUNTS                MV Area (PHT):4.36 cm      +--------------+-------+ +--------------+----------++    Systemic VTI: 0.24 m  MV PHT:       50.46 msec    +--------------+-------+ +--------------+----------++    Systemic Diam:1.90 cm MV Decel Time:174 msec      +--------------+-------+ +--------------+----------++ +----------------+-----------++ MR Peak grad:   145.4 mmHg  +----------------+-----------++ MR Mean grad:   78.0 mmHg   +----------------+-----------++ MR Vmax:        603.00 cm/s +----------------+-----------++ MR Vmean:       399.0 cm/s  +----------------+-----------++ MR PISA:        6.28 cm    +----------------+-----------++ MR PISA Eff ROA:96 mm      +----------------+-----------++ MR PISA Radius: 1.00 cm     +----------------+-----------++ +--------------+-----------++ MV E velocity:95.60 cm/s  +--------------+-----------++ MV A velocity:125.00 cm/s +--------------+-----------++ MV E/A ratio: 0.76        +--------------+-----------++  +---------+-------+ IVC               +---------+-------+ IVC diam:1.70 cm +---------+-------+   Jennifer Crape MD Electronically signed by Jennifer Crape MD Signature Date/Time: 03/21/2019/12:56:31 PM    Final          ______________________________________________________________________________________________      EKG Interpretation Date/Time:  Wednesday August 21 2024 08:35:45 EST Ventricular Rate:  75 PR Interval:    QRS Duration:  166 QT Interval:  446 QTC Calculation: 498 R Axis:   -57  Text Interpretation: Ventricular-paced rhythm with premature ventricular or aberrantly conducted complexes Underlying atrial flutter When compared with ECG of 14-Apr-2023 15:12, Vent. rate has increased BY  14 BPM Confirmed by Monetta Rogue ([id number]) on 08/21/2024 8:48:24 AM   Recent Labs: 10/17/2023: ALT 24; BUN 12; Creatinine, Ser 0.72; Hemoglobin 13.2; Platelets 204; Potassium 3.8; Sodium 143  Recent Lipid Panel    Component Value Date/Time   CHOL 160 10/17/2023 1149   TRIG 89 10/17/2023 1149   HDL 63 10/17/2023 1149   CHOLHDL 2.5 10/17/2023 1149   LDLCALC 81 10/17/2023 1149    Physical Exam:    VS:  BP (!) 112/58   Pulse 76   Ht 5' 4 (1.626 m)   Wt 136 lb (61.7 kg)   SpO2 98%   BMI 23.34 kg/m     Wt Readings from Last 3 Encounters:  08/21/24 136 lb (61.7 kg)  10/17/23 138 lb 3.2 oz (62.7 kg)  04/14/23 141 lb 12.8 oz (64.3 kg)     GEN: She is beginning to look frail well nourished, well developed in no acute distress HEENT: Normal NECK: No JVD; No carotid bruits LYMPHATICS: No lymphadenopathy CARDIAC: RRR, no murmurs, rubs, gallops RESPIRATORY:  Clear to auscultation without rales, wheezing or rhonchi  ABDOMEN: Soft, non-tender, non-distended MUSCULOSKELETAL:  No edema; No deformity  SKIN: Warm and dry NEUROLOGIC:  Alert and oriented x 3 PSYCHIATRIC:  Normal affect    Signed, Rogue Monetta, MD  08/21/2024 8:59 AM    [facility]

## 2024-08-21 ENCOUNTER — Encounter: Payer: Self-pay | Admitting: Cardiology

## 2024-08-21 ENCOUNTER — Ambulatory Visit: Attending: Cardiology | Admitting: Cardiology

## 2024-08-21 VITALS — BP 112/58 | HR 76 | Ht 64.0 in | Wt 136.0 lb

## 2024-08-21 DIAGNOSIS — Z950 Presence of cardiac pacemaker: Secondary | ICD-10-CM

## 2024-08-21 DIAGNOSIS — I5032 Chronic diastolic (congestive) heart failure: Secondary | ICD-10-CM

## 2024-08-21 DIAGNOSIS — Z7901 Long term (current) use of anticoagulants: Secondary | ICD-10-CM | POA: Diagnosis not present

## 2024-08-21 DIAGNOSIS — I441 Atrioventricular block, second degree: Secondary | ICD-10-CM | POA: Diagnosis not present

## 2024-08-21 DIAGNOSIS — I110 Hypertensive heart disease with heart failure: Secondary | ICD-10-CM

## 2024-08-21 DIAGNOSIS — I480 Paroxysmal atrial fibrillation: Secondary | ICD-10-CM

## 2024-08-21 DIAGNOSIS — I340 Nonrheumatic mitral (valve) insufficiency: Secondary | ICD-10-CM

## 2024-08-21 DIAGNOSIS — E782 Mixed hyperlipidemia: Secondary | ICD-10-CM

## 2024-08-21 MED ORDER — CARVEDILOL 6.25 MG PO TABS: 6.2500 mg | ORAL_TABLET | Freq: Two times a day (BID) | ORAL | 2 refills | Status: AC

## 2024-08-21 NOTE — Patient Instructions (Signed)
 Medication Instructions:  Your physician recommends that you continue on your current medications as directed. Please refer to the Current Medication list given to you today.  *If you need a refill on your cardiac medications before your next appointment, please call your pharmacy*  Lab Work: Your physician recommends that you return for lab work in:   Labs today: CBC, BMP, Lipids  If you have labs (blood work) drawn today and your tests are completely normal, you will receive your results only by: MyChart Message (if you have MyChart) OR A paper copy in the mail If you have any lab test that is abnormal or we need to change your treatment, we will call you to review the results.  Testing/Procedures: None  Follow-Up: At [facility], you and your health needs are our priority.  As part of our continuing mission to provide you with exceptional heart care, our providers are all part of one team.  This team includes your primary Cardiologist (physician) and Advanced Practice Providers or APPs (Physician Assistants and Nurse Practitioners) who all work together to provide you with the care you need, when you need it.  Your next appointment:   9 month(s)  Provider:   Redell Leiter, MD    We recommend signing up for the patient portal called MyChart.  Sign up information is provided on this After Visit Summary.  MyChart is used to connect with patients for Virtual Visits (Telemedicine).  Patients are able to view lab/test results, encounter notes, upcoming appointments, etc.  Non-urgent messages can be sent to your provider as well.   To learn more about what you can do with MyChart, go to [email or web address].   Other Instructions None

## 2024-08-22 LAB — BASIC METABOLIC PANEL WITH GFR
BUN/Creatinine Ratio: 18 (ref 12–28)
BUN: 14 mg/dL (ref 8–27)
CO2: 24 mmol/L (ref 20–29)
Calcium: 10.8 mg/dL — ABNORMAL HIGH (ref 8.7–10.3)
Chloride: 100 mmol/L (ref 96–106)
Creatinine, Ser: 0.79 mg/dL (ref 0.57–1.00)
Glucose: 89 mg/dL (ref 70–99)
Potassium: 4.5 mmol/L (ref 3.5–5.2)
Sodium: 138 mmol/L (ref 134–144)
eGFR: 74 mL/min/1.73 (ref >59)

## 2024-08-22 LAB — CBC
Hematocrit: 41.8 % (ref 34.0–46.6)
Hemoglobin: 13.4 g/dL (ref 11.1–15.9)
MCH: 30.3 pg (ref 26.6–33.0)
MCHC: 32.1 g/dL (ref 31.5–35.7)
MCV: 95 fL (ref 79–97)
Platelets: 288 x10E3/uL (ref 150–450)
RBC: 4.42 x10E6/uL (ref 3.77–5.28)
RDW: 12.6 % (ref 11.7–15.4)
WBC: 4 x10E3/uL (ref 3.4–10.8)

## 2024-08-22 LAB — LIPID PANEL
Chol/HDL Ratio: 2.3 ratio (ref 0.0–4.4)
Cholesterol, Total: 152 mg/dL (ref 100–199)
HDL: 66 mg/dL (ref >39)
LDL Chol Calc (NIH): 74 mg/dL (ref 0–99)
Triglycerides: 55 mg/dL (ref 0–149)
VLDL Cholesterol Cal: 12 mg/dL (ref 5–40)

## 2024-08-25 ENCOUNTER — Ambulatory Visit: Payer: Self-pay | Admitting: Cardiology

## 2024-08-27 ENCOUNTER — Telehealth: Payer: Self-pay | Admitting: Cardiology

## 2024-08-27 NOTE — Telephone Encounter (Signed)
 Pt requesting a c/b regarding OTC medications that she would like added to her current medication list. Please advise

## 2024-08-30 ENCOUNTER — Other Ambulatory Visit: Payer: Self-pay

## 2024-08-30 NOTE — Telephone Encounter (Signed)
 Left message for the patient to call back.

## 2024-08-30 NOTE — Telephone Encounter (Signed)
 Called the patient and reconciled the medications in her medication list with her. Patient stated that was all she was taking at this time. Patient had no further questions at this time.

## 2024-09-02 ENCOUNTER — Ambulatory Visit: Attending: Family Medicine

## 2024-10-22 ENCOUNTER — Ambulatory Visit: Attending: Cardiology

## 2024-10-22 DIAGNOSIS — I441 Atrioventricular block, second degree: Secondary | ICD-10-CM

## 2024-10-23 LAB — CUP PACEART REMOTE DEVICE CHECK
Battery Remaining Longevity: 1 mo
Battery Remaining Percentage: 1 %
Battery Voltage: 2.63 V
Brady Statistic AP VP Percent: 69 %
Brady Statistic AP VS Percent: 1 %
Brady Statistic AS VP Percent: 30 %
Brady Statistic AS VS Percent: 1 %
Brady Statistic RA Percent Paced: 64 %
Brady Statistic RV Percent Paced: 99 %
Date Time Interrogation Session: 20260127040014
Implantable Lead Connection Status: 753985
Implantable Lead Connection Status: 753985
Implantable Lead Implant Date: 20170215
Implantable Lead Implant Date: 20170215
Implantable Lead Location: 753859
Implantable Lead Location: 753860
Implantable Pulse Generator Implant Date: 20170215
Lead Channel Impedance Value: 390 Ohm
Lead Channel Impedance Value: 550 Ohm
Lead Channel Pacing Threshold Amplitude: 0.5 V
Lead Channel Pacing Threshold Amplitude: 0.625 V
Lead Channel Pacing Threshold Pulse Width: 0.4 ms
Lead Channel Pacing Threshold Pulse Width: 0.4 ms
Lead Channel Sensing Intrinsic Amplitude: 12 mV
Lead Channel Sensing Intrinsic Amplitude: 5 mV
Lead Channel Setting Pacing Amplitude: 0.875
Lead Channel Setting Pacing Amplitude: 2 V
Lead Channel Setting Pacing Pulse Width: 0.4 ms
Lead Channel Setting Sensing Sensitivity: 4 mV
Pulse Gen Model: 2240
Pulse Gen Serial Number: 7863758

## 2024-10-24 ENCOUNTER — Ambulatory Visit: Payer: Self-pay | Admitting: Cardiology

## 2024-10-31 NOTE — Progress Notes (Signed)
 Remote PPM Transmission

## 2024-11-11 ENCOUNTER — Ambulatory Visit: Admitting: Cardiology

## 2025-01-21 ENCOUNTER — Ambulatory Visit

## 2025-04-22 ENCOUNTER — Ambulatory Visit
